# Patient Record
Sex: Female | Born: 2000 | Race: Black or African American | Hispanic: No | Marital: Single | Smoking: Never smoker
Health system: Southern US, Community
[De-identification: ages and names within clinical notes are randomized; demographics above are authoritative.]

---
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
## 2022-01-07 ENCOUNTER — Other Ambulatory Visit (INDEPENDENT_AMBULATORY_CARE_PROVIDER_SITE_OTHER): Payer: Self-pay | Admitting: Pediatric Endocrinology

## 2022-01-23 ENCOUNTER — Ambulatory Visit (INDEPENDENT_AMBULATORY_CARE_PROVIDER_SITE_OTHER): Payer: Medicaid Other | Admitting: Pediatric Endocrinology

## 2022-03-12 ENCOUNTER — Telehealth (INDEPENDENT_AMBULATORY_CARE_PROVIDER_SITE_OTHER): Payer: Self-pay | Admitting: Pediatric Endocrinology

## 2022-03-12 MED ORDER — ESCITALOPRAM OXALATE 5 MG PO TABS: 5.0000 mg | ORAL_TABLET | Freq: Every day | ORAL | 0 refills | Status: DC

## 2022-03-12 NOTE — Telephone Encounter (Signed)
?  Who's calling (name and relationship to patient) :  Army Fossa;  ? ?Best contact number: ?[phone] ? ?Provider they see: ?Dr. Vanessa Strandquist ? ?Reason for call: ?Mom has called in to see if Becky can have a 90 day supply of the Lexapro  ? ? ? ?PRESCRIPTION REFILL ONLY ? ?Name of prescription: ? ?[facility] [place],  [address] ? ?

## 2022-03-13 NOTE — Telephone Encounter (Signed)
Called to get pt scheduled had to LVM to call the office to schedule an appt with Dr. Vanessa [place].  ?

## 2022-03-22 ENCOUNTER — Encounter (INDEPENDENT_AMBULATORY_CARE_PROVIDER_SITE_OTHER): Payer: Self-pay | Admitting: Pediatric Endocrinology

## 2022-03-22 NOTE — Telephone Encounter (Signed)
A letter has been sent out.

## 2022-06-27 ENCOUNTER — Other Ambulatory Visit (INDEPENDENT_AMBULATORY_CARE_PROVIDER_SITE_OTHER): Payer: Self-pay | Admitting: Pediatric Endocrinology

## 2022-11-14 ENCOUNTER — Other Ambulatory Visit (INDEPENDENT_AMBULATORY_CARE_PROVIDER_SITE_OTHER): Payer: Self-pay | Admitting: Pediatric Endocrinology

## 2022-11-14 DIAGNOSIS — N914 Secondary oligomenorrhea: Secondary | ICD-10-CM

## 2023-02-07 ENCOUNTER — Other Ambulatory Visit (INDEPENDENT_AMBULATORY_CARE_PROVIDER_SITE_OTHER): Payer: Self-pay | Admitting: Pediatric Endocrinology

## 2023-02-07 DIAGNOSIS — N914 Secondary oligomenorrhea: Secondary | ICD-10-CM

## 2023-03-18 ENCOUNTER — Ambulatory Visit (INDEPENDENT_AMBULATORY_CARE_PROVIDER_SITE_OTHER): Payer: Medicaid Other | Admitting: Pediatric Endocrinology

## 2023-03-18 ENCOUNTER — Encounter (INDEPENDENT_AMBULATORY_CARE_PROVIDER_SITE_OTHER): Payer: Self-pay | Admitting: Pediatric Endocrinology

## 2023-03-18 DIAGNOSIS — E88819 Insulin resistance, unspecified: Secondary | ICD-10-CM | POA: Diagnosis not present

## 2023-03-18 DIAGNOSIS — N915 Oligomenorrhea, unspecified: Secondary | ICD-10-CM

## 2023-03-18 DIAGNOSIS — R635 Abnormal weight gain: Secondary | ICD-10-CM

## 2023-03-18 DIAGNOSIS — N914 Secondary oligomenorrhea: Secondary | ICD-10-CM

## 2023-03-18 MED ORDER — NORETHIN ACE-ETH ESTRAD-FE 1.5-30 MG-MCG PO TABS: 1.0000 | ORAL_TABLET | Freq: Every day | ORAL | 12 refills | Status: DC

## 2023-03-18 NOTE — Progress Notes (Signed)
Subjective:  Subjective  Patient Name: Pamela Berg Date of Birth: [birth year]  MRN: [id number]  Pamela Berg  presents to the office today for evaluation and management of her oligomenorrhea with elevated androgen levels   HISTORY OF PRESENT ILLNESS:   Pamela Berg is a 22 y.o. AA female   Thurma was unaccompanied today  1. Pamela Berg was seen by her PCP in October for her 18 year [place]. At that visit they discussed that she had not had a period in about 3-4 months. She had labs drawn which showed elevation in testosterone and androstenedione. She was referred for evaluation.    2. Pamela Berg was last seen in pediatric endocrine clinic on 10/23/21. In the interim she has been generally healthy.    She has returned to clinic at this time seeking a refill of her OCP prescription.   She has done well over the past year with her OCP. She feels that she is having regular cycles that are not super painful.   She is living on campus and walking around campus. She is not doing other exercise.  She is at [employer].   She is drinking water and some juice (not apple).   She feels that she is not eating as much as she used to. She typically eats 1-2 times a day.   She states that the birth control helped to level out her mood/depression. She stopped taking the Lexapro because she felt that it wasn't really helping anymore. She does not feel that she needs anything like that now. She is looking into getting back into therapy.   drawing, guitar, crochet.   She had continued on Junel 1.5/30. She has been out of medication for about the past month. She has a boyfriend but states that they are not having sex. They have been together about 3 months.    Maternal grandmother with history of PCOS.  Maternal grandparents with history of type 2 diabetes.   3. Pertinent Review of Systems:  Constitutional: The patient feels "good". The patient seems healthy and active. Eyes: Vision seems to be good. There are no recognized  eye problems. Neck: The patient has no complaints of anterior neck swelling, soreness, tenderness, pressure, discomfort, or difficulty swallowing.   Heart: Heart rate increases with exercise or other physical activity. The patient has no complaints of palpitations, irregular heart beats, chest pain, or chest pressure.   Lungs: no asthma or wheezing Gastrointestinal: Bowel movents seem normal. The patient has no complaints of excessive hunger, acid reflux, upset stomach, stomach aches or pains, diarrhea, or constipation.  Legs: Muscle mass and strength seem normal. There are no complaints of numbness, tingling, burning, or pain. No edema is noted.  Feet: There are no obvious foot problems. There are no complaints of numbness, tingling, burning, or pain. No edema is noted. Hands: now with bilateral ganglion cysts at wrist.  Neurologic: There are no recognized problems with muscle movement and strength, sensation, or coordination. GYN/GU: per HPI. LMP 03/10/23  PAST MEDICAL, FAMILY, AND SOCIAL HISTORY  Past Medical History:  Diagnosis Date   Premature baby     Family History  Problem Relation Age of Onset   Depression Mother    ADD / ADHD Mother    Hypertension Mother    Vision loss Mother    Cancer Maternal Aunt    Diabetes Maternal Grandmother    Arthritis Maternal Grandmother    Hypertension Maternal Grandmother    Miscarriages / Stillbirths Maternal Grandmother    Diabetes Maternal  Grandfather    Stroke Maternal Grandfather    Arthritis Maternal Grandfather    Hypertension Maternal Grandfather      Current Outpatient Medications:    norethindrone-ethinyl estradiol-iron (JUNEL FE 1.5/30) 1.5-30 MG-MCG tablet, TAKE 1 TABLET BY MOUTH EVERY DAY, Disp: 30 tablet, Rfl: 0   clindamycin-benzoyl peroxide (BENZACLIN) gel, Apply 1 application topically every morning., Disp: , Rfl:    escitalopram (LEXAPRO) 5 MG tablet, TAKE 1 TABLET BY MOUTH EVERY DAY, Disp: 30 tablet, Rfl: 0  Allergies  as of 03/18/2023 - Review Complete 03/18/2023  Allergen Reaction Noted   Shellfish-derived products Swelling 10/26/2019     reports that she has never smoked. She has never been exposed to tobacco smoke. She has never used smokeless tobacco. She reports that she does not drink alcohol and does not use drugs. Pediatric History  Patient Parents   Errico,Nadia (Mother)   Other Topics Concern   Not on file  Social History Narrative   12th grade at [employer].    Lives with mom and partner, and 2 dogs.    1. School and Family: [employer] for [employer]. Lives on [place] 2. Activities: not active  3. Primary Care Provider: [facility] [place]  ROS: There are no other significant problems involving Pamela Berg's other body systems.    Objective:  Objective  Vital Signs:    BP 124/82 (BP Location: Left Arm, Patient Position: Sitting, Cuff Size: Large)   Pulse 88   Wt 225 lb 3.2 oz (102.2 kg)   LMP 03/10/2023 (Exact Date)   Blood pressure %iles are not available for patients who are 18 years or older.  Ht Readings from Last 3 Encounters:  10/23/21 5' 4.17" (1.63 m) (49 %, Z= -0.02)*  03/26/21 5\' 4"  (1.626 m) (47 %, Z= -0.07)*  11/28/20 5' 4.17" (1.63 m) (50 %, Z= 0.01)*   * Growth percentiles are based on CDC (Girls, 2-20 Years) data.   Wt Readings from Last 3 Encounters:  03/18/23 225 lb 3.2 oz (102.2 kg) (99 %, Z= 2.26)*  10/23/21 (!) 215 lb 6 oz (97.7 kg) (98 %, Z= 2.16)*  03/26/21 (!) 212 lb 3.2 oz (96.3 kg) (98 %, Z= 2.14)*   * Growth percentiles are based on CDC (Girls, 2-20 Years) data.   HC Readings from Last 3 Encounters:  No data found for [facility]   There is no height or weight on file to calculate BSA. No height on file for this encounter. 99 %ile (Z= 2.26) based on CDC (Girls, 2-20 Years) weight-for-age data using vitals from 03/18/2023.   PHYSICAL EXAM:   Constitutional: The patient appears healthy and well nourished. The patient's height and weight are heavy  for age. +10 pounds since last visit. Head: The head is normocephalic. Face: The face appears normal. There are no obvious dysmorphic features. Eyes: The eyes appear to be normally formed and spaced. Gaze is conjugate. There is no obvious arcus or proptosis. Moisture appears normal. Ears: The ears are normally placed and appear externally normal. Neck: The neck appears to be visibly normal. The thyroid gland is enlarged today. The consistency of the thyroid gland is firm. The thyroid gland is not tender to palpation. Lungs: Normal work of breathing Heart: Normal pulses and peripheral perfusion Abdomen: The abdomen appears to be large in size for the patient's age. Arms: Muscle size and bulk are normal for age. Hands: There is no obvious tremor. Phalangeal and metacarpophalangeal joints are normal. Palmar muscles are normal for age.  Palmar skin is normal. Palmar moisture is also normal. Legs: Muscles appear normal for age. No edema is present. Feet: Feet are normally formed.  Neurologic: Strength is normal for age in both the upper and lower extremities. Muscle tone is normal. Sensation to touch is normal in both the legs and feet.   GYN/GU: Puberty: Tanner stage pubic hair: V Tanner stage breast/genital V.  LAB DATA:   No results found for this or any previous visit (from the past 672 hour(s)).    Assessment and Plan:  Assessment  ASSESSMENT: Shantise is a 22 y.o. female referred for oligomenorrhea and elevated androgen levels. She is now having regular menses on OCP.    Oligomenorrhea/elevated androgens -  Has continued on Junel fe 1.5/30 since last visit -  Now having monthly cycles with light flow and minimal cramping - No issues  Weight gain/insulin resistance - ReSet goals for next visit of increased exercise,  and decreased juice intake.  - Doing well with portion sizes and decreased snacking.   Follow-up: Return in about 9 months (around 12/18/2023).      Lelon Huh,  MD   LOS >30 minutes spent today reviewing the medical chart, counseling the patient/family, and documenting today's encounter.      Patient referred by [facility] [place] for oligomenorrhea  Copy of this note sent to [facility] [place]

## 2023-07-04 ENCOUNTER — Encounter (INDEPENDENT_AMBULATORY_CARE_PROVIDER_SITE_OTHER): Payer: Self-pay

## 2023-12-18 ENCOUNTER — Ambulatory Visit (INDEPENDENT_AMBULATORY_CARE_PROVIDER_SITE_OTHER): Payer: Medicaid Other | Admitting: Pediatric Endocrinology

## 2024-05-11 ENCOUNTER — Other Ambulatory Visit (INDEPENDENT_AMBULATORY_CARE_PROVIDER_SITE_OTHER): Payer: Self-pay | Admitting: Pediatric Endocrinology

## 2024-05-11 DIAGNOSIS — N914 Secondary oligomenorrhea: Secondary | ICD-10-CM

## 2024-05-12 ENCOUNTER — Telehealth (INDEPENDENT_AMBULATORY_CARE_PROVIDER_SITE_OTHER): Payer: Self-pay | Admitting: Pediatric Endocrinology

## 2024-05-12 NOTE — Telephone Encounter (Signed)
 Called I informed her that we can't send in the refill since she hasn't been seen she needs to go to her pcp to get the birth control.

## 2024-05-12 NOTE — Telephone Encounter (Signed)
  Name of who is calling: Pamela Berg Relationship to Patient: self  Best contact number: [phone]   Provider they see: [facility]  Reason for call: Pt stated that she needs a RX refill for Junel FE but stated she doesn't know if she needs an appt in order to refill     PRESCRIPTION REFILL ONLY  Name of prescription:  Pharmacy:

## 2024-06-16 NOTE — Progress Notes (Signed)
 Subjective   HPI:  Pamela Berg is a 23 y.o.  female who presents to the office with:  Chief Complaint  Patient presents with  . Establish Care    Patient presents today to establish care. Patient would like to discuss having birth control filled, Junel. Concerns of discharge. Onset about 2 weeks ago. Refill on epipen .   Patient with PMHx PCOS/oligomenorrhea, vitamin D deficiency, anxiety/depression, obesity presents to establish care. She is requesting a refill of her Junel OCP for management of PCOS and pregnancy prevention. LMP 05/15/24. Also needs a refill of her epi pen she carries due to shellfish allergy.  She also complains of 2 weeks of vaginal discharge.  She describes this as a creamy white texture with a slight odor.  No associated vaginal pain, itching, urinary symptoms, abdominal pain, nausea, vomiting, fevers, chills.  Anxiety/depression - patient reports mental health is in a good place right now and she has a great support system.  Bilateral palmar ganglion cysts - does not bother patient.   PMH: Past medical history, Past surgical history, Social history, family history were reviewed as noted in EMR.  Pertinent for:  History reviewed. No pertinent past medical history.   Medications and allergies reviewed.  ROS: All systems were negative including constitutional, CV, respiratory, GI/GU except for those noted in the HPI.  Objective   Vital Signs: BP 130/86 (BP Location: Right Upper Arm)   Pulse 96   Temp 97.3 F (36.3 C) (Temporal)   Resp 14   Ht 5' 4.45 (1.637 m)   Wt 245 lb 6.4 oz (111.3 kg)   SpO2 97%   BMI 41.54 kg/m   Wt Readings from Last 3 Encounters:  06/16/24 245 lb 6.4 oz (111.3 kg)   Physical Exam Constitutional:      General: She is not in acute distress.    Appearance: Normal appearance. She is obese.  Cardiovascular:     Rate and Rhythm: Normal rate and regular rhythm.     Pulses: Normal pulses.     Heart sounds: Normal heart sounds. No  murmur heard.    No gallop.  Pulmonary:     Effort: Pulmonary effort is normal. No respiratory distress.     Breath sounds: Normal breath sounds. No wheezing, rhonchi or rales.  Abdominal:     General: Bowel sounds are normal. There is no distension.     Palpations: Abdomen is soft.     Tenderness: There is no abdominal tenderness. There is no guarding.  Genitourinary:    Vagina: Vaginal discharge (white creamy) and erythema present. No tenderness, bleeding or lesions.     Cervix: Normal.     Uterus: Normal.      Adnexa: Right adnexa normal and left adnexa normal.     Comments: Chaperone present Musculoskeletal:     Cervical back: Neck supple. No tenderness.     Right lower leg: No edema.     Left lower leg: No edema.  Lymphadenopathy:     Cervical: No cervical adenopathy.  Skin:    General: Skin is warm and dry.  Neurological:     Mental Status: She is alert. Mental status is at baseline.  Psychiatric:        Mood and Affect: Mood normal.      Assessment/Plan   Orders Placed This Encounter  Procedures  . BV, Candida, TV, CT, & GC, NAA vag swab Vaginal  . CBC And Differential  . Comprehensive Metabolic Panel  . TSH Rfx  on Abnormal to Free T4  . Lipid Panel  . Hemoglobin A1c  . Vitamin D 25 Hydroxy  . POCT Urinalysis Dipstick  . POCT urine pregnancy test    1. Vaginal discharge   2. Oligomenorrhea, unspecified type   3. Vitamin D deficiency   4. Depression with anxiety   5. Palmar wrist ganglion   6. BCP (birth control pills) initiation   7. Shellfish allergy    Vaginal discharge UA negative for signs of infection. Vaginal swab obtained and results pending. Await results to help guide management. Return precautions given.  Oligomenorrhea Check basic labs today. UPT negative in office and refill of Junel OCP sent.   Vitamin D deficiency Check today.   Depression with anxiety Currently stable and patient declines additional support.   Palmar wrist  ganglion Stable, does not affect ADLs.   Shellfish allergy Refill Epi pen sent.    Follow up in about 3 months (around 09/16/2024) for pending labs.  No future appointments.  Medications at end of visit today:  Current Outpatient Medications:  .  EPINEPHrine  (EPIPEN  2-PAK) 0.3 mg/0.3 mL injection, Inject 0.3 mLs (0.3 mg dose) into the muscle once as needed for Anaphylaxis for up to 1 dose., Disp: 1 each, Rfl: 0 .  norethindrone-ethinyl estradiol-iron (LOESTRIN-FE,MICROGESTIN-FE,JUNEL-FE,LARIN-FE) 1.5-30 MG-MCG tablet, Take one tablet by mouth daily., Disp: 28 tablet, Rfl: 2  Patient Care Team: Chianne Kline, PA-C as PCP - General (Family Medicine)

## 2024-08-03 NOTE — Progress Notes (Signed)
 Subjective   HPI:  Pamela Berg is a 23 y.o.  female who presents to the office with:  Chief Complaint  Patient presents with  . Depression  . Anxiety   Patient presents to discuss her mental health. She states her anxiety and depression have worsened since her last office visit to the point where she is no longer able to manage them on her own. She constantly feels anxious for no reason and has been more fidgety/restless. Symptoms are starting to affect her grades in school to where she may lose her financial aid - which only worsens her anxiety. She also feels depressed living at home with her parents and feels like a burden. She is currently unemployed but has a job interview next week. She would like to discuss medication and therapy. Previously on Lexapro  and thought it helped initially but stopped working for her which led her to discontinuing it. She had a great therapist that was helpful, but she left the office and her replacement was not a good fit for patient.  1. Little interest or pleasure in doing things: 3 (08/03/2024  3:13 PM) 2. Feeling down, depressed, or hopeless: 2 (08/03/2024  3:13 PM) PHQ-2 Total Score: 5 (08/03/2024  3:13 PM) 3. Trouble falling or staying asleep: 2 (08/03/2024  3:13 PM) 4. Feeling tired or having little energy: 2 (08/03/2024  3:13 PM) 5. Poor appetite or overeating: 2 (08/03/2024  3:13 PM) 6. Feeling bad about yourself - or that you are a failure or have let yourself or your family down: 3 (08/03/2024  3:13 PM) 7. Trouble concentrating on things, such as reading the newspaper or watching television: 0 (08/03/2024  3:13 PM) 8. Moving or speaking so slowly that other people could have noticed.  Or the opposite - being so fidgety or restless that you have been moving around a lot more than usual.: 2 (08/03/2024  3:13 PM) 9. Thoughts that you would be better off dead, or of hurting yourself in some way.: 0 (08/03/2024  3:13 PM) PHQ Total Score: 16 (08/03/2024  3:13 PM) 10. How  difficult have these problems made it for you to do your work, take care of things at home or get along with other people?: Somewhat difficult (08/03/2024  3:13 PM)   GAD7 Review       08/03/2024  Generalized Anxiety Disorder 7 item (GAD-7)  1. Feeling nervous, anxious, or on the edge 3  2. Not being able to stop or control worrying 3  3. Worrying too much about different things 2  4. Trouble relaxing 1  5. Being so restless that it's hard to sit still 2  6. Being easily annoyed or irritable 1  7. Feeling afraid as if something awful might happen 2  Total Score 14  Interpretation:  Moderate, likely to have an anxiety disorder   If you checked off any problems, how difficult have these made it for you to do your work, take care of things at home, or get along with other people? 1     PMH: Past medical history, Past surgical history, Social history, family history were reviewed as noted in EMR.  Pertinent for:  History reviewed. No pertinent past medical history.   Medications and allergies reviewed.  ROS: Per HPI. No SI, HI  Objective   Vital Signs: BP (!) 124/96 (BP Location: Left Upper Arm)   Pulse 104   Temp 98.5 F (36.9 C) (Temporal)   Resp 18   Ht 5'  4.45 (1.637 m)   Wt 246 lb 3.2 oz (111.7 kg)   LMP 07/12/2024   SpO2 97%   Breastfeeding No   BMI 41.67 kg/m   Wt Readings from Last 3 Encounters:  08/03/24 246 lb 3.2 oz (111.7 kg)  06/16/24 245 lb 6.4 oz (111.3 kg)   Physical Exam Constitutional:      General: She is not in acute distress.    Appearance: Normal appearance.  Neurological:     Mental Status: She is alert.  Psychiatric:        Mood and Affect: Mood normal.      Assessment/Plan   Orders Placed This Encounter  Procedures  . Ambulatory referral to Psychology/Testing    1. Depression with anxiety     Depression with anxiety Worsened since last visit. Discussed medication including risks, benefits, and potential SE. Start trial of  Sertraline  and will titrate to effectiveness. Additionally discussed therapy and psych referral has been placed. Patient has appt scheduled 09/16/24 at which time will reassess symptoms. Encouraged her to follow up sooner as needed. Also provided local mental health resources.    No follow-ups on file.  Future Appointments  Date Time Provider Department Center  09/16/2024  2:00 PM Chianne Kline, PA-C [facility] None    Medications at end of visit today: Current Medications[1]  Patient Care Team: Chianne Kline, PA-C as PCP - General (Family Medicine)          [1]  Current Outpatient Medications:  .  EPINEPHrine  (EPIPEN  2-PAK) 0.3 mg/0.3 mL injection, Inject 0.3 mLs (0.3 mg dose) into the muscle once as needed for Anaphylaxis for up to 1 dose., Disp: 1 each, Rfl: 0 .  ergocalciferol (VITAMIN D2) 50,000 units CAPS capsule, Take one capsule (50,000 Units dose) by mouth once a week at 0900 for 12 doses., Disp: 12 capsule, Rfl: 0 .  metroNIDAZOLE (METROGEL) 0.75% vaginal gel, Place vaginally at bedtime. For 5 nights, Disp: 70 g, Rfl: 0 .  norethindrone-ethinyl estradiol-iron (LOESTRIN-FE,MICROGESTIN-FE,JUNEL-FE,LARIN-FE) 1.5-30 MG-MCG tablet, Take one tablet by mouth daily., Disp: 28 tablet, Rfl: 2 .  sertraline  (ZOLOFT ) 25 mg tablet, Take one tablet (25 mg dose) by mouth daily., Disp: 30 tablet, Rfl: 2

## 2024-09-16 NOTE — Progress Notes (Signed)
 Subjective   HPI:  Pamela Berg is a 23 y.o.  female who presents to the office with:  Chief Complaint  Patient presents with  . Follow-up   Patient presents today for 3 month follow up. She reports completing her vitamin D and is agreeable to rechecking it today. She also completed treatment for BV and chlamydia, however does continue to experience vaginal discharge with an odor. She is agreeable to retesting today.   She and her mom have noticed positive improvements in her mental health and she would like to continue the Sertraline . No side effects noted.   ROS: Negative for constitutional, CV, Resp, Abd/GU symptoms except as noted above in HPI.  PMH: Past medical history, Past surgical history, Social history, family history were reviewed as noted in EMR.  Pertinent for:  History reviewed. No pertinent past medical history.   Medications and allergies reviewed.   Objective    Physical Exam:  Vital Signs: BP 108/84   Pulse 68   Temp 98.9 F (37.2 C) (Temporal)   Resp 16   Ht 5' 4.45 (1.637 m)   Wt 237 lb 3.2 oz (107.6 kg)   LMP 09/04/2024 (Exact Date)   SpO2 99%   Breastfeeding No   BMI 40.15 kg/m   General:  Well appearing, Alert & Oriented CV:  Normal to palpation without thrill.  Regular Rate and Rhythm, No murmurs, rubs, or gallops.   Respiratory:  Normal respiratory effort.  No wheezes or crackles.  Good air movement without diminished sounds.   Abdomen:  Normal bowel sounds.  Non-tender to palpation.  No rebound or guarding.  No palpable liver or spleen. Extremities:  No pretibial edema  Assessment/Plan   Orders Placed This Encounter  Procedures  . CT, GC & TV, NAA vag swab Urine  . Vitamin D 25 Hydroxy    Depression with anxiety Improved, continue Sertraline  25mg . Patient had not heard from psych referral so I provided her with [facility]'s office number and she will call to schedule an appointment.  Vitamin D deficiency Patient completed 12 weeks of  prescription vitamin D. Will recheck level today.   Vaginal discharge, BV, chlamydia STD retesting today. Will trial course of Clindamycin PO for continued BV symptoms. No red flags or systemic symptoms reported. Exam benign and vitals are stable.  No follow-ups on file.  No future appointments.  Medications at end of visit today: Current Medications[1]       [1]  Current Outpatient Medications:  .  clindamycin (CLEOCIN) 300 mg capsule, Take one capsule (300 mg dose) by mouth 3 (three) times a day for 7 days., Disp: 21 capsule, Rfl: 0 .  EPINEPHrine  (EPIPEN  2-PAK) 0.3 mg/0.3 mL injection, Inject 0.3 mLs (0.3 mg dose) into the muscle once as needed for Anaphylaxis for up to 1 dose., Disp: 1 each, Rfl: 0 .  ergocalciferol (VITAMIN D2) 50,000 units CAPS capsule, Take one capsule (50,000 Units dose) by mouth once a week at 0900 for 12 doses., Disp: 12 capsule, Rfl: 0 .  norethindrone-ethinyl estradiol-iron (LOESTRIN-FE,MICROGESTIN-FE,JUNEL-FE,LARIN-FE) 1.5-30 MG-MCG tablet, Take one tablet by mouth daily., Disp: 28 tablet, Rfl: 2 .  sertraline  (ZOLOFT ) 25 mg tablet, Take one tablet (25 mg dose) by mouth daily., Disp: 30 tablet, Rfl: 2

## 2024-09-22 ENCOUNTER — Encounter (HOSPITAL_BASED_OUTPATIENT_CLINIC_OR_DEPARTMENT_OTHER): Payer: Self-pay

## 2024-09-22 ENCOUNTER — Inpatient Hospital Stay (HOSPITAL_BASED_OUTPATIENT_CLINIC_OR_DEPARTMENT_OTHER)
Admission: EM | Admit: 2024-09-22 | Discharge: 2024-09-27 | DRG: 176 | Disposition: A | Discharge status: Home / Self Care | Attending: Internal Medicine | Admitting: Internal Medicine

## 2024-09-22 ENCOUNTER — Other Ambulatory Visit: Payer: Self-pay

## 2024-09-22 ENCOUNTER — Emergency Department (HOSPITAL_BASED_OUTPATIENT_CLINIC_OR_DEPARTMENT_OTHER)

## 2024-09-22 DIAGNOSIS — I2694 Multiple subsegmental pulmonary emboli without acute cor pulmonale: Principal | ICD-10-CM | POA: Diagnosis present

## 2024-09-22 DIAGNOSIS — Z833 Family history of diabetes mellitus: Secondary | ICD-10-CM

## 2024-09-22 DIAGNOSIS — Z793 Long term (current) use of hormonal contraceptives: Secondary | ICD-10-CM

## 2024-09-22 DIAGNOSIS — E669 Obesity, unspecified: Secondary | ICD-10-CM | POA: Diagnosis present

## 2024-09-22 DIAGNOSIS — R55 Syncope and collapse: Secondary | ICD-10-CM | POA: Diagnosis not present

## 2024-09-22 DIAGNOSIS — D72829 Elevated white blood cell count, unspecified: Secondary | ICD-10-CM | POA: Diagnosis present

## 2024-09-22 DIAGNOSIS — I2699 Other pulmonary embolism without acute cor pulmonale: Secondary | ICD-10-CM | POA: Diagnosis present

## 2024-09-22 DIAGNOSIS — Z6841 Body Mass Index (BMI) 40.0 and over, adult: Secondary | ICD-10-CM

## 2024-09-22 DIAGNOSIS — Z1152 Encounter for screening for COVID-19: Secondary | ICD-10-CM

## 2024-09-22 DIAGNOSIS — Z8249 Family history of ischemic heart disease and other diseases of the circulatory system: Secondary | ICD-10-CM

## 2024-09-22 DIAGNOSIS — R030 Elevated blood-pressure reading, without diagnosis of hypertension: Secondary | ICD-10-CM | POA: Diagnosis present

## 2024-09-22 DIAGNOSIS — R0902 Hypoxemia: Secondary | ICD-10-CM | POA: Diagnosis not present

## 2024-09-22 DIAGNOSIS — T384X5A Adverse effect of oral contraceptives, initial encounter: Secondary | ICD-10-CM | POA: Diagnosis present

## 2024-09-22 DIAGNOSIS — R000 Tachycardia, unspecified: Secondary | ICD-10-CM | POA: Diagnosis not present

## 2024-09-22 DIAGNOSIS — Z91013 Allergy to seafood: Secondary | ICD-10-CM

## 2024-09-22 LAB — CBC WITH DIFFERENTIAL/PLATELET
Abs Immature Granulocytes: 0.06 K/uL (ref 0.00–0.07)
Basophils Absolute: 0.1 K/uL (ref 0.0–0.1)
Basophils Relative: 1 %
Eosinophils Absolute: 0.2 K/uL (ref 0.0–0.5)
Eosinophils Relative: 1 %
HCT: 42.7 % (ref 36.0–46.0)
Hemoglobin: 13.7 g/dL (ref 12.0–15.0)
Immature Granulocytes: 1 %
Lymphocytes Relative: 18 %
Lymphs Abs: 2.3 K/uL (ref 0.7–4.0)
MCH: 23.9 pg — ABNORMAL LOW (ref 26.0–34.0)
MCHC: 32.1 g/dL (ref 30.0–36.0)
MCV: 74.5 fL — ABNORMAL LOW (ref 80.0–100.0)
Monocytes Absolute: 0.9 K/uL (ref 0.1–1.0)
Monocytes Relative: 8 %
Neutro Abs: 8.9 K/uL — ABNORMAL HIGH (ref 1.7–7.7)
Neutrophils Relative %: 71 %
Platelets: 293 K/uL (ref 150–400)
RBC: 5.73 MIL/uL — ABNORMAL HIGH (ref 3.87–5.11)
RDW: 15.4 % (ref 11.5–15.5)
WBC: 12.4 K/uL — ABNORMAL HIGH (ref 4.0–10.5)
nRBC: 0 % (ref 0.0–0.2)

## 2024-09-22 LAB — PREGNANCY, URINE: Preg Test, Ur: NEGATIVE

## 2024-09-22 LAB — TROPONIN T, HIGH SENSITIVITY
Troponin T High Sensitivity: <15 ng/L (ref 0–19)
Troponin T High Sensitivity: <15 ng/L (ref 0–19)

## 2024-09-22 LAB — D-DIMER, QUANTITATIVE: D-Dimer, Quant: 10.54 ug{FEU}/mL — ABNORMAL HIGH (ref 0.00–0.50)

## 2024-09-22 LAB — RESP PANEL BY RT-PCR (RSV, FLU A&B, COVID)  RVPGX2
Influenza A by PCR: NEGATIVE
Influenza B by PCR: NEGATIVE
Resp Syncytial Virus by PCR: NEGATIVE
SARS Coronavirus 2 by RT PCR: NEGATIVE

## 2024-09-22 LAB — BASIC METABOLIC PANEL WITH GFR
Anion gap: 13 (ref 5–15)
BUN: 9 mg/dL (ref 6–20)
CO2: 21 mmol/L — ABNORMAL LOW (ref 22–32)
Calcium: 9.4 mg/dL (ref 8.9–10.3)
Chloride: 104 mmol/L (ref 98–111)
Creatinine, Ser: 0.7 mg/dL (ref 0.44–1.00)
GFR, Estimated: >60 mL/min (ref >60)
Glucose, Bld: 92 mg/dL (ref 70–99)
Potassium: 4.6 mmol/L (ref 3.5–5.1)
Sodium: 138 mmol/L (ref 135–145)

## 2024-09-22 LAB — PRO BRAIN NATRIURETIC PEPTIDE: Pro Brain Natriuretic Peptide: <50 pg/mL (ref <300.0)

## 2024-09-22 MED ORDER — HEPARIN (PORCINE) 25000 UT/250ML-% IV SOLN
1500.0000 [IU]/h | INTRAVENOUS | Status: DC
Start: 2024-09-22 — End: 2024-09-25
  Administered 2024-09-22: 1350 [IU]/h via INTRAVENOUS
  Administered 2024-09-24 – 2024-09-25 (×2): 1500 [IU]/h via INTRAVENOUS
  Filled 2024-09-22 (×4): qty 250

## 2024-09-22 MED ORDER — SODIUM CHLORIDE 0.9 % IV BOLUS
1000.0000 mL | Freq: Once | INTRAVENOUS | Status: AC
  Administered 2024-09-22: 1000 mL via INTRAVENOUS

## 2024-09-22 MED ORDER — HEPARIN BOLUS VIA INFUSION
4500.0000 [IU] | Freq: Once | INTRAVENOUS | Status: AC
  Administered 2024-09-22: 4500 [IU] via INTRAVENOUS

## 2024-09-22 MED ORDER — ACETAMINOPHEN 500 MG PO TABS
1000.0000 mg | ORAL_TABLET | Freq: Once | ORAL | Status: AC
  Administered 2024-09-22: 1000 mg via ORAL
  Filled 2024-09-22: qty 2

## 2024-09-22 MED ORDER — IOHEXOL 350 MG/ML SOLN
80.0000 mL | Freq: Once | INTRAVENOUS | Status: AC | PRN
  Administered 2024-09-22: 80 mL via INTRAVENOUS

## 2024-09-22 NOTE — ED Provider Notes (Signed)
 [place] [facility] Provider Note   CSN: [id number] Arrival date & time: 09/22/24  1644     Patient presents with: Shortness of Breath   Otisha Spickler is a 23 y.o. female.   Patient is a 23 year old female with no significant past medical history who presents with shortness of breath and left-sided chest pain.  She has had some pain in her left chest and left upper back for about 2 days.  She says it hurts when she breathes and hurts when she moves.  She did pick up a heavy jug of water and thought maybe she injured it because of that.  She has been having some intermittent shortness of breath.  She also has noticed that her left leg seems a little more swollen than the right.  No calf pain.  No fevers.  No cough or cold symptoms other than a little bit of nasal congestion which she attributes to allergies.  No history of DVT.  She is on oral contraceptives.       Prior to Admission medications   Medication Sig Start Date End Date Taking? Authorizing Provider  norethindrone-ethinyl estradiol-iron (JUNEL FE 1.5/30) 1.5-30 MG-MCG tablet Take 1 tablet by mouth daily. 03/18/23   Dorrene Nest, MD    Allergies: Shellfish-derived products    Review of Systems  Constitutional:  Positive for fatigue. Negative for chills, diaphoresis and fever.  HENT:  Negative for congestion, rhinorrhea and sneezing.   Eyes: Negative.   Respiratory:  Positive for shortness of breath. Negative for cough.   Cardiovascular:  Positive for chest pain and leg swelling.  Gastrointestinal:  Negative for abdominal pain, diarrhea, nausea and vomiting.  Genitourinary:  Negative for difficulty urinating, flank pain, frequency and hematuria.  Musculoskeletal:  Negative for arthralgias and back pain.  Skin:  Negative for rash.  Neurological:  Negative for dizziness, speech difficulty, weakness, numbness and headaches.    Updated Vital Signs BP (!) 142/94   Pulse (!) 115    Temp (!) 100.5 F (38.1 C) (Oral)   Resp (!) 21   LMP 09/02/2024 (Exact Date)   SpO2 97%   Physical Exam Constitutional:      Appearance: She is well-developed.  HENT:     Head: Normocephalic and atraumatic.  Eyes:     Pupils: Pupils are equal, round, and reactive to light.  Cardiovascular:     Rate and Rhythm: Normal rate and regular rhythm.     Heart sounds: Normal heart sounds.  Pulmonary:     Effort: Pulmonary effort is normal. No respiratory distress.     Breath sounds: Normal breath sounds. No wheezing or rales.     Comments: No reproducible tenderness to the chest wall Chest:     Chest wall: No tenderness.  Abdominal:     General: Bowel sounds are normal.     Palpations: Abdomen is soft.     Tenderness: There is no abdominal tenderness. There is no guarding or rebound.  Musculoskeletal:        General: Normal range of motion.     Cervical back: Normal range of motion and neck supple.     Comments: Trace edema to left lower extremity as compared to the right.  No calf tenderness  Lymphadenopathy:     Cervical: No cervical adenopathy.  Skin:    General: Skin is warm and dry.     Findings: No rash.  Neurological:     Mental Status: She is alert and  oriented to person, place, and time.     (all labs ordered are listed, but only abnormal results are displayed) Labs Reviewed  BASIC METABOLIC PANEL WITH GFR - Abnormal; Notable for the following components:      Result Value   CO2 21 (*)    All other components within normal limits  D-DIMER, QUANTITATIVE - Abnormal; Notable for the following components:   D-Dimer, Quant 10.54 (*)    All other components within normal limits  CBC WITH DIFFERENTIAL/PLATELET - Abnormal; Notable for the following components:   WBC 12.4 (*)    RBC 5.73 (*)    MCV 74.5 (*)    MCH 23.9 (*)    Neutro Abs 8.9 (*)    All other components within normal limits  RESP PANEL BY RT-PCR (RSV, FLU A&B, COVID)  RVPGX2  PREGNANCY, URINE  HEPARIN   LEVEL (UNFRACTIONATED)  TROPONIN T, HIGH SENSITIVITY  TROPONIN T, HIGH SENSITIVITY    EKG: EKG Interpretation Date/Time:  Wednesday September 22 2024 16:53:58 EDT Ventricular Rate:  123 PR Interval:  135 QRS Duration:  75 QT Interval:  310 QTC Calculation: 444 R Axis:   63  Text Interpretation: Sinus tachycardia Borderline T abnormalities, anterior leads No old tracing to compare Confirmed by Lenor Hollering [phone]) on 09/22/2024 6:39:06 PM  Radiology: CT Angio Chest PE W/Cm &/Or Wo Cm Result Date: 09/22/2024 EXAM: CTA CHEST 09/22/2024 08:54:38 PM TECHNIQUE: CTA of the chest was performed after the administration of intravenous contrast (iohexol  (OMNIPAQUE ) 350 MG/ML injection 80 mL IOHEXOL  350 MG/ML SOLN). Multiplanar reformatted images are provided for review. MIP images are provided for review. Automated exposure control, iterative reconstruction, and/or weight based adjustment of the mA/kV was utilized to reduce the radiation dose to as low as reasonably achievable. COMPARISON: None available. CLINICAL HISTORY: Pulmonary embolism (PE) suspected, low to intermediate prob, positive D-dimer. PE suspected, elevated Ddimer, lower leg and feet swelling, tachycardic. FINDINGS: PULMONARY ARTERIES: There are branching intraluminal filling defects identified within the lobar and segmental pulmonary arteries of the lower lobes bilaterally in keeping with acute pulmonary emboli. The embolic burden is moderate. MEDIASTINUM: No CT evidence of right heart strain. No significant coronary artery calcification. Global cardiac size within normal limits. No pericardial effusion. Thoracic Aorta is unremarkable. LYMPH NODES: No pathologic thoracic adenopathy. LUNGS AND PLEURA: Patchy ground glass opacity within the basilar lingula, which may represent atelectasis or infiltrate in the setting of a developing pulmonary infarct. Bibasilar atelectasis noted. No pneumothorax or pleural effusion. No central obstructing  lesion. UPPER ABDOMEN: Limited images of the upper abdomen are unremarkable. SOFT TISSUES AND BONES: No acute bone abnormality. No lytic or blastic bone lesion. IMPRESSION: 1. Acute pulmonary emboli involving the lobar and segmental pulmonary arteries of the lower lobes bilaterally, with moderate embolic burden. No CT evidence of right heart strain. 2. Patchy ground glass opacity within the basilar lingula, possibly representing atelectasis or infiltrate in the setting of a developing pulmonary infarct.\ 3. Findings were discussed personally with Dr. Lenor at time of dictation Electronically signed by: Dorethia Molt MD 09/22/2024 09:16 PM EDT RP Workstation: [id number]   DG Chest 2 View Result Date: 09/22/2024 CLINICAL DATA:  SHOB EXAM: CHEST - 2 VIEW COMPARISON:  None available. FINDINGS: Lower lung volumes. Bilateral perihilar interstitial opacities. No focal airspace consolidation, pleural effusion, or pneumothorax. No cardiomegaly.No acute fracture or destructive lesion. IMPRESSION: Bilateral perihilar interstitial opacities, which may represent bronchovascular crowding due to low lung volumes, atypical/viral infection, or changes of asthma. Electronically Signed  By: Rogelia Myers M.D.   On: 09/22/2024 17:29     Procedures   Medications Ordered in the ED  heparin  ADULT infusion 100 units/mL (25000 units/250mL) (1,350 Units/hr Intravenous New Bag/Given 09/22/24 2138)  iohexol  (OMNIPAQUE ) 350 MG/ML injection 80 mL (80 mLs Intravenous Contrast Given 09/22/24 2052)  acetaminophen  (TYLENOL ) tablet 1,000 mg (1,000 mg Oral Given 09/22/24 2102)  sodium chloride  0.9 % bolus 1,000 mL (0 mLs Intravenous Stopped 09/22/24 2233)  heparin  bolus via infusion 4,500 Units (4,500 Units Intravenous Bolus from Bag 09/22/24 2138)                                    Medical Decision Making Amount and/or Complexity of Data Reviewed Labs: ordered. Radiology: ordered.  Risk OTC drugs. Prescription drug  management. Decision regarding hospitalization.   This patient presents to the ED for concern of chest pain, shortness of breath, this involves an extensive number of treatment options, and is a complaint that carries with it a high risk of complications and morbidity.  I considered the following differential and admission for this acute, potentially life threatening condition.  The differential diagnosis includes pneumonia, pneumothorax, PE, pleurisy, musculoskeletal pain, pericarditis, ACS  MDM:    Patient is a 23 year old who presents with left-sided chest pain and shortness of breath.  She is tachycardic on arrival.  No hypoxia.  She did develop a low-grade temp of 100.5.  Chest x-ray does not show any acute abnormality.  No pneumonia or pneumothorax.  She has some slight swelling of her left leg as compared to the right.  I had ordered ultrasound but unfortunately we do not have ultrasound here today.  D-dimer is positive.  CTA of the chest was performed which shows bilateral PEs with a fairly significant clot burden.  No evidence of right heart strain.  There is developing pulmonary infarct in the lingula.  She was started on heparin .  Her troponins are normal.  Discussed with Dr. Maree with pulmonology who recommends patient be admitted to the hospitalist service.  Does not recommend thrombectomy/thrombolytics.  Discussed with Dr. Franky who will admit the patient for further treatment.  CRITICAL CARE Performed by: Andrea Ness Total critical care time: 70 minutes Critical care time was exclusive of separately billable procedures and treating other patients. Critical care was necessary to treat or prevent imminent or life-threatening deterioration. Critical care was time spent personally by me on the following activities: development of treatment plan with patient and/or surrogate as well as nursing, discussions with consultants, evaluation of patient's response to treatment, examination of  patient, obtaining history from patient or surrogate, ordering and performing treatments and interventions, ordering and review of laboratory studies, ordering and review of radiographic studies, pulse oximetry and re-evaluation of patient's condition.  (Labs, imaging, consults)  Labs: I Ordered, and personally interpreted labs.  The pertinent results include: Elevated D-dimer, normal troponin  Imaging Studies ordered: I ordered imaging studies including chest x-ray, CT chest I independently visualized and interpreted imaging. I agree with the radiologist interpretation  Additional history obtained from  .  External records from outside source obtained and reviewed including    Cardiac Monitoring: The patient was maintained on a cardiac monitor.  If on the cardiac monitor, I personally viewed and interpreted the cardiac monitored which showed an underlying rhythm of: Sinus tachycardia  Reevaluation: After the interventions noted above, I reevaluated the patient and found that they  have :improved  Social Determinants of Health:    Disposition: Admit to hospital  Co morbidities that complicate the patient evaluation  Past Medical History:  Diagnosis Date   Premature baby      Medicines Meds ordered this encounter  Medications   iohexol  (OMNIPAQUE ) 350 MG/ML injection 80 mL   acetaminophen  (TYLENOL ) tablet 1,000 mg   sodium chloride  0.9 % bolus 1,000 mL   heparin  bolus via infusion 4,500 Units   heparin  ADULT infusion 100 units/mL (25000 units/250mL)    I have reviewed the patients home medicines and have made adjustments as needed  Problem List / ED Course: Problem List Items Addressed This Visit   None Visit Diagnoses       Multiple subsegmental pulmonary emboli without acute cor pulmonale (HCC)    -  Primary   Relevant Medications   heparin  bolus via infusion 4,500 Units (Completed)   heparin  ADULT infusion 100 units/mL (25000 units/250mL)                 Final diagnoses:  Multiple subsegmental pulmonary emboli without acute cor pulmonale [facility])    ED Discharge Orders     None          Lenor Hollering, MD 09/22/24 2322

## 2024-09-22 NOTE — Progress Notes (Signed)
 PHARMACY - ANTICOAGULATION CONSULT NOTE  Pharmacy Consult for heparin  Indication: pulmonary embolus  Allergies  Allergen Reactions   Shellfish-Derived Products Swelling    Patient Measurements:    Vital Signs: Temp: 100.5 F (38.1 C) (09/24 1947) Temp Source: Oral (09/24 1947) BP: 138/92 (09/24 1947) Pulse Rate: 113 (09/24 1947)  Labs: Recent Labs    09/22/24 1913  HGB 13.7  HCT 42.7  PLT 293  CREATININE 0.70    CrCl cannot be calculated (Unknown ideal weight.).   Medical History: Past Medical History:  Diagnosis Date   Premature baby      Assessment: 46 YOF presenting with SOB/CP, CT angio chest shows acute PE, no RHS.  She is not on anticoagulation PTA  Goal of Therapy:  Heparin  level 0.3-0.7 units/ml Monitor platelets by anticoagulation protocol: Yes   Plan:  Heparin  4500 units IV x 1, and gtt at 1350 units/hr F/u 6 hour heparin  level F/u long term [facility] plan  Dorn Poot, PharmD, [facility] Clinical Pharmacist ED Pharmacist Phone # [phone] 09/22/2024 9:30 PM

## 2024-09-22 NOTE — ED Notes (Signed)
 Report called to [facility] 2W and given to Evan

## 2024-09-22 NOTE — ED Triage Notes (Signed)
 Reports SHOB since this morning. No obvious signs of respiratory distress in triage. Denies chest pain, NV, cough, fevers  Reports L sided rib pain for 2 days, states she thinks pulled something after picking up water jug.

## 2024-09-23 ENCOUNTER — Inpatient Hospital Stay (HOSPITAL_COMMUNITY)

## 2024-09-23 DIAGNOSIS — R55 Syncope and collapse: Secondary | ICD-10-CM

## 2024-09-23 DIAGNOSIS — Z91013 Allergy to seafood: Secondary | ICD-10-CM | POA: Diagnosis not present

## 2024-09-23 DIAGNOSIS — Z1152 Encounter for screening for COVID-19: Secondary | ICD-10-CM | POA: Diagnosis not present

## 2024-09-23 DIAGNOSIS — T384X5A Adverse effect of oral contraceptives, initial encounter: Secondary | ICD-10-CM | POA: Diagnosis present

## 2024-09-23 DIAGNOSIS — R030 Elevated blood-pressure reading, without diagnosis of hypertension: Secondary | ICD-10-CM | POA: Diagnosis present

## 2024-09-23 DIAGNOSIS — Z6841 Body Mass Index (BMI) 40.0 and over, adult: Secondary | ICD-10-CM | POA: Diagnosis not present

## 2024-09-23 DIAGNOSIS — Z86711 Personal history of pulmonary embolism: Secondary | ICD-10-CM | POA: Diagnosis not present

## 2024-09-23 DIAGNOSIS — D72829 Elevated white blood cell count, unspecified: Secondary | ICD-10-CM | POA: Diagnosis present

## 2024-09-23 DIAGNOSIS — I2602 Saddle embolus of pulmonary artery with acute cor pulmonale: Secondary | ICD-10-CM | POA: Diagnosis not present

## 2024-09-23 DIAGNOSIS — Z833 Family history of diabetes mellitus: Secondary | ICD-10-CM | POA: Diagnosis not present

## 2024-09-23 DIAGNOSIS — Z8249 Family history of ischemic heart disease and other diseases of the circulatory system: Secondary | ICD-10-CM | POA: Diagnosis not present

## 2024-09-23 DIAGNOSIS — I2699 Other pulmonary embolism without acute cor pulmonale: Secondary | ICD-10-CM | POA: Diagnosis present

## 2024-09-23 DIAGNOSIS — E669 Obesity, unspecified: Secondary | ICD-10-CM | POA: Diagnosis present

## 2024-09-23 DIAGNOSIS — Z793 Long term (current) use of hormonal contraceptives: Secondary | ICD-10-CM | POA: Diagnosis not present

## 2024-09-23 DIAGNOSIS — R0902 Hypoxemia: Secondary | ICD-10-CM | POA: Diagnosis not present

## 2024-09-23 DIAGNOSIS — I2694 Multiple subsegmental pulmonary emboli without acute cor pulmonale: Secondary | ICD-10-CM | POA: Diagnosis present

## 2024-09-23 DIAGNOSIS — R000 Tachycardia, unspecified: Secondary | ICD-10-CM | POA: Diagnosis not present

## 2024-09-23 LAB — ECHOCARDIOGRAM COMPLETE
Area-P 1/2: 4.8 cm2
Calc EF: 67 %
Height: 64 in
S' Lateral: 2.7 cm
Single Plane A2C EF: 70.2 %
Single Plane A4C EF: 65 %
Weight: 3844.82 [oz_av]

## 2024-09-23 LAB — RESPIRATORY PANEL BY PCR

## 2024-09-23 LAB — HEPARIN LEVEL (UNFRACTIONATED)
Heparin Unfractionated: 0.41 [IU]/mL (ref 0.30–0.70)
Heparin Unfractionated: 0.37 [IU]/mL (ref 0.30–0.70)

## 2024-09-23 LAB — URINALYSIS, ROUTINE W REFLEX MICROSCOPIC
Bilirubin Urine: NEGATIVE
Glucose, UA: NEGATIVE mg/dL
Hgb urine dipstick: NEGATIVE
Ketones, ur: 20 mg/dL — AB
Leukocytes,Ua: NEGATIVE
Nitrite: NEGATIVE
Protein, ur: NEGATIVE mg/dL
Specific Gravity, Urine: 1.039 — ABNORMAL HIGH (ref 1.005–1.030)
pH: 6 (ref 5.0–8.0)

## 2024-09-23 LAB — COMPREHENSIVE METABOLIC PANEL WITH GFR
ALT: 8 U/L (ref 0–44)
AST: 11 U/L — ABNORMAL LOW (ref 15–41)
Albumin: 3.7 g/dL (ref 3.5–5.0)
Alkaline Phosphatase: 70 U/L (ref 38–126)
Anion gap: 14 (ref 5–15)
BUN: 6 mg/dL (ref 6–20)
CO2: 21 mmol/L — ABNORMAL LOW (ref 22–32)
Calcium: 9 mg/dL (ref 8.9–10.3)
Chloride: 104 mmol/L (ref 98–111)
Creatinine, Ser: 0.72 mg/dL (ref 0.44–1.00)
GFR, Estimated: >60 mL/min (ref >60)
Glucose, Bld: 99 mg/dL (ref 70–99)
Potassium: 3.9 mmol/L (ref 3.5–5.1)
Sodium: 138 mmol/L (ref 135–145)
Total Bilirubin: 0.4 mg/dL (ref 0.0–1.2)
Total Protein: 7.1 g/dL (ref 6.5–8.1)

## 2024-09-23 LAB — CBC
HCT: 38.9 % (ref 36.0–46.0)
Hemoglobin: 12.3 g/dL (ref 12.0–15.0)
MCH: 23.9 pg — ABNORMAL LOW (ref 26.0–34.0)
MCHC: 31.6 g/dL (ref 30.0–36.0)
MCV: 75.7 fL — ABNORMAL LOW (ref 80.0–100.0)
Platelets: 305 K/uL (ref 150–400)
RBC: 5.14 MIL/uL — ABNORMAL HIGH (ref 3.87–5.11)
RDW: 15.5 % (ref 11.5–15.5)
WBC: 14.5 K/uL — ABNORMAL HIGH (ref 4.0–10.5)
nRBC: 0 % (ref 0.0–0.2)

## 2024-09-23 LAB — PROCALCITONIN: Procalcitonin: <0.1 ng/mL

## 2024-09-23 LAB — SEDIMENTATION RATE: Sed Rate: 28 mm/h — ABNORMAL HIGH (ref 0–22)

## 2024-09-23 LAB — HIV ANTIBODY (ROUTINE TESTING W REFLEX): HIV Screen 4th Generation wRfx: NONREACTIVE

## 2024-09-23 LAB — GLUCOSE, CAPILLARY: Glucose-Capillary: 106 mg/dL — ABNORMAL HIGH (ref 70–99)

## 2024-09-23 LAB — MRSA NEXT GEN BY PCR, NASAL: MRSA by PCR Next Gen: NOT DETECTED

## 2024-09-23 MED ORDER — ACETAMINOPHEN 325 MG PO TABS
650.0000 mg | ORAL_TABLET | Freq: Four times a day (QID) | ORAL | Status: DC | PRN
  Administered 2024-09-23: 650 mg via ORAL
  Filled 2024-09-23 (×2): qty 2

## 2024-09-23 MED ORDER — ACETAMINOPHEN 650 MG RE SUPP: 650.0000 mg | Freq: Four times a day (QID) | RECTAL | Status: DC | PRN

## 2024-09-23 MED ORDER — ORAL CARE MOUTH RINSE: 15.0000 mL | OROMUCOSAL | Status: DC | PRN

## 2024-09-23 MED ORDER — SODIUM CHLORIDE 0.9 % IV SOLN: INTRAVENOUS | Status: DC

## 2024-09-23 MED ORDER — ONDANSETRON HCL 4 MG PO TABS: 4.0000 mg | ORAL_TABLET | Freq: Four times a day (QID) | ORAL | Status: DC | PRN

## 2024-09-23 MED ORDER — SODIUM CHLORIDE 0.9 % IV SOLN
100.0000 mg | Freq: Two times a day (BID) | INTRAVENOUS | Status: DC
  Administered 2024-09-23 – 2024-09-24 (×4): 100 mg via INTRAVENOUS
  Filled 2024-09-23 (×5): qty 100

## 2024-09-23 MED ORDER — ALBUTEROL SULFATE (2.5 MG/3ML) 0.083% IN NEBU: 2.5000 mg | INHALATION_SOLUTION | RESPIRATORY_TRACT | Status: DC | PRN

## 2024-09-23 MED ORDER — CHLORHEXIDINE GLUCONATE CLOTH 2 % EX PADS
6.0000 | MEDICATED_PAD | Freq: Every day | CUTANEOUS | Status: DC
  Administered 2024-09-23: 6 via TOPICAL

## 2024-09-23 MED ORDER — MORPHINE SULFATE (PF) 2 MG/ML IV SOLN
1.0000 mg | INTRAVENOUS | Status: DC | PRN
  Administered 2024-09-23 – 2024-09-26 (×5): 1 mg via INTRAVENOUS
  Filled 2024-09-23 (×7): qty 1

## 2024-09-23 MED ORDER — MELATONIN 5 MG PO TABS: 5.0000 mg | ORAL_TABLET | Freq: Every evening | ORAL | Status: DC | PRN

## 2024-09-23 MED ORDER — ONDANSETRON HCL 4 MG/2ML IJ SOLN: 4.0000 mg | Freq: Four times a day (QID) | INTRAMUSCULAR | Status: DC | PRN

## 2024-09-23 MED ORDER — HYDROCODONE-ACETAMINOPHEN 5-325 MG PO TABS
1.0000 | ORAL_TABLET | ORAL | Status: DC | PRN
  Administered 2024-09-23: 1 via ORAL
  Administered 2024-09-23: 2 via ORAL
  Administered 2024-09-23 (×2): 1 via ORAL
  Administered 2024-09-23 – 2024-09-27 (×14): 2 via ORAL
  Filled 2024-09-23 (×6): qty 2
  Filled 2024-09-23: qty 1
  Filled 2024-09-23 (×10): qty 2
  Filled 2024-09-23: qty 1
  Filled 2024-09-23: qty 2

## 2024-09-23 NOTE — Progress Notes (Signed)
 PHARMACY - ANTICOAGULATION CONSULT NOTE  Pharmacy Consult for heparin  Indication: pulmonary embolus  Allergies  Allergen Reactions   Shellfish-Derived Products Anaphylaxis    Patient Measurements: Height: 5' 4 (162.6 cm) Weight: 109 kg (240 lb 4.8 oz) IBW/kg (Calculated) : 54.7 HEPARIN  DW (KG): 80.6  Vital Signs: Temp: 98.1 F (36.7 C) (09/25 1138) Temp Source: Oral (09/25 1138) BP: 122/66 (09/25 0600) Pulse Rate: 88 (09/25 0600)  Labs: Recent Labs    09/22/24 1913 09/23/24 0414 09/23/24 1114  HGB 13.7 12.3  --   HCT 42.7 38.9  --   PLT 293 305  --   HEPARINUNFRC  --  0.41 0.37  CREATININE 0.70 0.72  --     Estimated Creatinine Clearance: 135.3 mL/min (by C-G formula based on SCr of 0.72 mg/dL).   Medical History: Past Medical History:  Diagnosis Date   Premature baby      Assessment: 43 YOF presenting with SOB/CP, CT angio chest shows acute PE, no RHS.  She is not on anticoagulation PTA  09/23/2024:  Confirmatory heparin  level 0.37-  remain therapeutic on IV heparin  1350 units/hr CBC: Hg, Plt WNL No bleeding or infusion related concerns reported by RN  Goal of Therapy:  Heparin  level 0.3-0.7 units/ml Monitor platelets by anticoagulation protocol: Yes   Plan:  Continue heparin  infusion at 1350 units/hr Daily heparin  level & CBC while on heparin  F/u long term AC plan  Rosaline IVAR Edison, Pharm.D Use secure chat for questions 09/23/2024 12:22 PM

## 2024-09-23 NOTE — Progress Notes (Signed)
 [facility] PROGRESS NOTE   Pamela Berg FMW:[id number] DOB: 08/23/2001 DOA: 09/22/2024  PCP: Kline, Chianne, PA-C  Brief History: 23 y.o. female with  no significant past medical history who presents to [facility] with complaint of shortness of breath x 1 day.  No recent travel.  No sick contacts.  No hospitalizations.  She does take oral contraceptives on a daily basis.  Patient was evaluated in the ED and underwent CT angiogram of her chest which showed acute pulmonary embolism.  She was hospitalized for further management.   Consultants: Pulmonology  Procedures: Echocardiogram is pending.  Lower extremity Doppler studies pending.    Subjective/Interval History: Patient complains of 5 out of 10 pain in the left chest.  Some shortness of breath.  No nausea or vomiting.  Complains of leg swelling specially in the left leg.  She also mentions that her mother has had blood clots previously and she thinks her grandmother also had blood clots previously.    Assessment/Plan:  Acute pulmonary embolism/syncope Moderate clot burden noted on CT angiogram.  Probable area of evolving pulmonary infarct was also noted.  Patient was noted to have leukocytosis.  Empirically on doxycycline . The only risk factor currently identified is use of oral contraceptives.  However it also appears that there may be a family history of thromboembolism.  She mentioned that her mother underwent genetic testing which was inconclusive.  Patient is currently on IV heparin .  Lower extremity Doppler study and echocardiogram are pending. Pulmonology has been consulted. She will benefit from being referred to hematology at discharge. Oral contraceptives have to be discontinued going forward.  This was explained to the patient. Patient also had an episode of syncope.  Currently stable.  Noted to be mildly tachycardic with normal blood pressures.  Obesity Estimated body mass index is 41.25 kg/m as calculated from  the following:   Height as of this encounter: 5' 4 (1.626 m).   Weight as of this encounter: 109 kg.   DVT Prophylaxis: On IV heparin  Code Status: Full code Family Communication: Discussed with patient.  Will update her parents later today Disposition Plan: Hopefully return home when improved  Status is: Inpatient Remains inpatient appropriate because: Acute pulmonary embolism requiring IV heparin  and further workup    Medications: Scheduled:  Chlorhexidine  Gluconate Cloth  6 each Topical Daily   Continuous:  sodium chloride  75 mL/hr at 09/23/24 0819   doxycycline  (VIBRAMYCIN ) IV 100 mg (09/23/24 0901)   heparin  1,350 Units/hr (09/23/24 0155)   PRN:acetaminophen  **OR** acetaminophen , albuterol , HYDROcodone -acetaminophen , melatonin, morphine  injection, ondansetron  **OR** ondansetron  (ZOFRAN ) IV, mouth rinse  Antibiotics: Anti-infectives (From admission, onward)    Start     Dose/Rate Route Frequency Ordered Stop   09/23/24 0800  doxycycline  (VIBRAMYCIN ) 100 mg in sodium chloride  0.9 % 250 mL IVPB        100 mg 125 mL/hr over 120 Minutes Intravenous Every 12 hours 09/23/24 0705         Objective:  Vital Signs  Vitals:   09/23/24 0535 09/23/24 0540 09/23/24 0600 09/23/24 0728  BP:  (!) 147/78 122/66   Pulse: (S) (!) 42 (!) 102 88   Resp:  (!) 25 (!) 21   Temp:    98.8 F (37.1 C)  TempSrc:    Oral  SpO2: 97% 98% 98%   Weight:      Height:        Intake/Output Summary (Last 24 hours) at 09/23/2024 0940 Last data filed at 09/23/2024 0155 Gross per  24 hour  Intake 1100.02 ml  Output --  Net 1100.02 ml   Filed Weights   09/23/24 0010 09/23/24 0029  Weight: 109 kg 109 kg    General appearance: Awake alert.  In no distress Resp: Mildly tachypneic without use of accessory muscles.  Diminished air entry at the bases.  No wheezing or rhonchi. Cardio: S1-S2 is tachycardic regular.  No S3-S4.  No rubs murmurs or bruit GI: Abdomen is soft.  Nontender nondistended.   Bowel sounds are present normal.  No masses organomegaly Extremities: Mild edema bilateral lower extremities but no significant unequal swelling is noted. Neurologic: Alert and oriented x3.  No focal neurological deficits.    Lab Results:  Data Reviewed: I have personally reviewed following labs and reports of the imaging studies  CBC: Recent Labs  Lab 09/22/24 1913 09/23/24 0414  WBC 12.4* 14.5*  NEUTROABS 8.9*  --   HGB 13.7 12.3  HCT 42.7 38.9  MCV 74.5* 75.7*  PLT 293 305    Basic Metabolic Panel: Recent Labs  Lab 09/22/24 1913 09/23/24 0414  NA 138 138  K 4.6 3.9  CL 104 104  CO2 21* 21*  GLUCOSE 92 99  BUN 9 6  CREATININE 0.70 0.72  CALCIUM 9.4 9.0    GFR: Estimated Creatinine Clearance: 135.3 mL/min (by C-G formula based on SCr of 0.72 mg/dL).  Liver Function Tests: Recent Labs  Lab 09/23/24 0414  AST 11*  ALT 8  ALKPHOS 70  BILITOT 0.4  PROT 7.1  ALBUMIN 3.7    BNP (last 3 results) Recent Labs    09/22/24 2214  PROBNP <50.0   CBG: Recent Labs  Lab 09/23/24 0537  GLUCAP 106*    Recent Results (from the past 240 hours)  Resp panel by RT-PCR (RSV, Flu A&B, Covid) Anterior Nasal Swab     Status: None   Collection Time: 09/22/24  9:03 PM   Specimen: Anterior Nasal Swab  Result Value Ref Range Status   SARS Coronavirus 2 by RT PCR NEGATIVE NEGATIVE Final    Comment: (NOTE) SARS-CoV-2 target nucleic acids are NOT DETECTED.  The SARS-CoV-2 RNA is generally detectable in upper respiratory specimens during the acute phase of infection. The lowest concentration of SARS-CoV-2 viral copies this assay can detect is 138 copies/mL. A negative result does not preclude SARS-Cov-2 infection and should not be used as the sole basis for treatment or other patient management decisions. A negative result may occur with  improper specimen collection/handling, submission of specimen other than nasopharyngeal swab, presence of viral mutation(s) within  the areas targeted by this assay, and inadequate number of viral copies(<138 copies/mL). A negative result must be combined with clinical observations, patient history, and epidemiological information. The expected result is Negative.  Fact Sheet for Patients:  BloggerCourse.com  Fact Sheet for Healthcare Providers:  SeriousBroker.it  This test is no t yet approved or cleared by the [place]  FDA and  has been authorized for detection and/or diagnosis of SARS-CoV-2 by FDA under an Emergency Use Authorization (EUA). This EUA will remain  in effect (meaning this test can be used) for the duration of the COVID-19 declaration under Section 564(b)(1) of the Act, 21 U.S.C.section 360bbb-3(b)(1), unless the authorization is terminated  or revoked sooner.       Influenza A by PCR NEGATIVE NEGATIVE Final   Influenza B by PCR NEGATIVE NEGATIVE Final    Comment: (NOTE) The Xpert Xpress SARS-CoV-2/FLU/RSV plus assay is intended as an aid in the  diagnosis of influenza from Nasopharyngeal swab specimens and should not be used as a sole basis for treatment. Nasal washings and aspirates are unacceptable for Xpert Xpress SARS-CoV-2/FLU/RSV testing.  Fact Sheet for Patients: BloggerCourse.com  Fact Sheet for Healthcare Providers: SeriousBroker.it  This test is not yet approved or cleared by the [place]  FDA and has been authorized for detection and/or diagnosis of SARS-CoV-2 by FDA under an Emergency Use Authorization (EUA). This EUA will remain in effect (meaning this test can be used) for the duration of the COVID-19 declaration under Section 564(b)(1) of the Act, 21 U.S.C. section 360bbb-3(b)(1), unless the authorization is terminated or revoked.     Resp Syncytial Virus by PCR NEGATIVE NEGATIVE Final    Comment: (NOTE) Fact Sheet for  Patients: BloggerCourse.com  Fact Sheet for Healthcare Providers: SeriousBroker.it  This test is not yet approved or cleared by the [place]  FDA and has been authorized for detection and/or diagnosis of SARS-CoV-2 by FDA under an Emergency Use Authorization (EUA). This EUA will remain in effect (meaning this test can be used) for the duration of the COVID-19 declaration under Section 564(b)(1) of the Act, 21 U.S.C. section 360bbb-3(b)(1), unless the authorization is terminated or revoked.  Performed at [facility], [address] [place], [place] [address]   MRSA Next Gen by PCR, Nasal     Status: None   Collection Time: 09/23/24 12:14 AM   Specimen: Nasal Mucosa; Nasal Swab  Result Value Ref Range Status   MRSA by PCR Next Gen NOT DETECTED NOT DETECTED Final    Comment: (NOTE) The GeneXpert MRSA Assay (FDA approved for NASAL specimens only), is one component of a comprehensive MRSA colonization surveillance program. It is not intended to diagnose MRSA infection nor to guide or monitor treatment for MRSA infections. Test performance is not FDA approved in patients less than 85 years old. Performed at [facility], [address], [place], [place] [address]       Radiology Studies: CT Angio Chest PE W/Cm &/Or Wo Cm Result Date: 09/22/2024 EXAM: CTA CHEST 09/22/2024 08:54:38 PM TECHNIQUE: CTA of the chest was performed after the administration of intravenous contrast (iohexol  (OMNIPAQUE ) 350 MG/ML injection 80 mL IOHEXOL  350 MG/ML SOLN). Multiplanar reformatted images are provided for review. MIP images are provided for review. Automated exposure control, iterative reconstruction, and/or weight based adjustment of the mA/kV was utilized to reduce the radiation dose to as low as reasonably achievable. COMPARISON: None available. CLINICAL HISTORY: Pulmonary embolism (PE) suspected, low to  intermediate prob, positive D-dimer. PE suspected, elevated Ddimer, lower leg and feet swelling, tachycardic. FINDINGS: PULMONARY ARTERIES: There are branching intraluminal filling defects identified within the lobar and segmental pulmonary arteries of the lower lobes bilaterally in keeping with acute pulmonary emboli. The embolic burden is moderate. MEDIASTINUM: No CT evidence of right heart strain. No significant coronary artery calcification. Global cardiac size within normal limits. No pericardial effusion. Thoracic Aorta is unremarkable. LYMPH NODES: No pathologic thoracic adenopathy. LUNGS AND PLEURA: Patchy ground glass opacity within the basilar lingula, which may represent atelectasis or infiltrate in the setting of a developing pulmonary infarct. Bibasilar atelectasis noted. No pneumothorax or pleural effusion. No central obstructing lesion. UPPER ABDOMEN: Limited images of the upper abdomen are unremarkable. SOFT TISSUES AND BONES: No acute bone abnormality. No lytic or blastic bone lesion. IMPRESSION: 1. Acute pulmonary emboli involving the lobar and segmental pulmonary arteries of the lower lobes bilaterally, with moderate embolic burden. No CT evidence  of right heart strain. 2. Patchy ground glass opacity within the basilar lingula, possibly representing atelectasis or infiltrate in the setting of a developing pulmonary infarct.\ 3. Findings were discussed personally with Dr. Lenor at time of dictation Electronically signed by: Dorethia Molt MD 09/22/2024 09:16 PM EDT RP Workstation: [id number]   DG Chest 2 View Result Date: 09/22/2024 CLINICAL DATA:  SHOB EXAM: CHEST - 2 VIEW COMPARISON:  None available. FINDINGS: Lower lung volumes. Bilateral perihilar interstitial opacities. No focal airspace consolidation, pleural effusion, or pneumothorax. No cardiomegaly.No acute fracture or destructive lesion. IMPRESSION: Bilateral perihilar interstitial opacities, which may represent bronchovascular crowding  due to low lung volumes, atypical/viral infection, or changes of asthma. Electronically Signed   By: Rogelia Myers M.D.   On: 09/22/2024 17:29       LOS: 0 days   Marley Charlot Verdene  [facility] Pager on [email or web address]  09/23/2024, 9:40 AM

## 2024-09-23 NOTE — Progress Notes (Signed)
 BLE venous duplex has been completed.   Results can be found under chart review under CV PROC. 09/23/2024 11:31 AM Makeyla Govan RVT, RDMS

## 2024-09-23 NOTE — H&P (Addendum)
 History and Physical    Pamela Berg FMW:[id number] DOB: Dec 18, 2001 DOA: 09/22/2024  PCP: Kline, Chianne, PA-C  Patient coming from: [facility]   I have personally briefly reviewed patient's old medical records in [facility]  Chief Complaint:   HPI: Pamela Berg is a 23 y.o. female with  no significant past medical history who presents to [facility] with complaint of shortness of breath x 1 day.Patient notes no associated n/v / cough marvina /chills  or chest pain, she does however note feeling congested. She however has noted left sided rib pain for the last 2 days which she ascribes to strained muscle s/p lifting heavy object.     ED Course:  Tmx:100.5, bp 139/75 ( 159/105), hr 126, rr 24, sat 98% on ra  HCG:neg  EKG: sinus tachycardia   Wbc 12.4, hgb 13.7, plt 293  D-dimer 10.54 Nqa 138, k 4.6, Cl 104, bicarb 21, glu 92, Cr 0.7 CE <15  RVP:neg CXR IMPRESSION: Bilateral perihilar interstitial opacities, which may represent bronchovascular crowding due to low lung volumes, atypical/viral infection, or changes of asthma.  CE <15  CTPE: IMPRESSION: 1. Acute pulmonary emboli involving the lobar and segmental pulmonary arteries of the lower lobes bilaterally, with moderate embolic burden. No CT evidence of right heart strain. 2. Patchy ground glass opacity within the basilar lingula, possibly representing atelectasis or infiltrate in the setting of a developing pulmonary infarct.\  Tx: tylenol  , NS 1L, heparin  drip    Of note patient course complicated on floor by vasovagal episode after using bathroom. Patient fully recovered.  Review of Systems: As per HPI otherwise 10 point review of systems negative.   Past Medical History:  Diagnosis Date   Premature baby     History reviewed. No pertinent surgical history.   reports that she has never smoked. She has never been exposed to tobacco smoke. She has never used smokeless tobacco. She reports that she  does not drink alcohol and does not use drugs.  Allergies  Allergen Reactions   Shellfish-Derived Products Swelling    Family History  Problem Relation Age of Onset   Depression Mother    ADD / ADHD Mother    Hypertension Mother    Vision loss Mother    Cancer Maternal Aunt    Diabetes Maternal Grandmother    Arthritis Maternal Grandmother    Hypertension Maternal Grandmother    Miscarriages / Stillbirths Maternal Grandmother    Diabetes Maternal Grandfather    Stroke Maternal Grandfather    Arthritis Maternal Grandfather    Hypertension Maternal Grandfather     Prior to Admission medications   Medication Sig Start Date End Date Taking? Authorizing Provider  norethindrone-ethinyl estradiol-iron (JUNEL FE 1.5/30) 1.5-30 MG-MCG tablet Take 1 tablet by mouth daily. 03/18/23   Dorrene Nest, MD    Physical Exam: Vitals:   09/22/24 2230 09/22/24 2300 09/23/24 0010 09/23/24 0029  BP: (!) 142/94 (!) 135/111  (!) 158/105  Pulse: (!) 115 (!) 114  (!) 122  Resp: (!) 21 (!) 23  (!) 27  Temp:   99.3 F (37.4 C)   TempSrc:   Oral   SpO2: 97% 99%    Weight:    109 kg  Height:   5' 4 (1.626 m)     Constitutional: NAD, calm, comfortable Vitals:   09/22/24 2230 09/22/24 2300 09/23/24 0010 09/23/24 0029  BP: (!) 142/94 (!) 135/111  (!) 158/105  Pulse: (!) 115 (!) 114  (!) 122  Resp: (!) 21 (!) 23  (!) 27  Temp:   99.3 F (37.4 C)   TempSrc:   Oral   SpO2: 97% 99%    Weight:    109 kg  Height:   5' 4 (1.626 m)    Physical Exam HENT:     Head: Normocephalic and atraumatic.  Cardiovascular:     Rate and Rhythm: Tachycardia present.  Pulmonary:     Breath sounds: No decreased breath sounds, wheezing, rhonchi or rales.  Musculoskeletal:     Cervical back: Normal range of motion and neck supple.     Right lower leg: No tenderness.  Neurological:     General: No focal deficit present.     Mental Status: She is alert.     Labs on Admission: I have personally reviewed  following labs and imaging studies  CBC: Recent Labs  Lab 09/22/24 1913  WBC 12.4*  NEUTROABS 8.9*  HGB 13.7  HCT 42.7  MCV 74.5*  PLT 293   Basic Metabolic Panel: Recent Labs  Lab 09/22/24 1913  NA 138  K 4.6  CL 104  CO2 21*  GLUCOSE 92  BUN 9  CREATININE 0.70  CALCIUM 9.4   GFR: Estimated Creatinine Clearance: 135.3 mL/min (by C-G formula based on SCr of 0.7 mg/dL). Liver Function Tests: No results for input(s): AST, ALT, ALKPHOS, BILITOT, PROT, ALBUMIN in the last 168 hours. No results for input(s): LIPASE, AMYLASE in the last 168 hours. No results for input(s): AMMONIA in the last 168 hours. Coagulation Profile: No results for input(s): INR, PROTIME in the last 168 hours. Cardiac Enzymes: No results for input(s): CKTOTAL, CKMB, CKMBINDEX, TROPONINI in the last 168 hours. BNP (last 3 results) Recent Labs    09/22/24 2214  PROBNP <50.0   HbA1C: No results for input(s): HGBA1C in the last 72 hours. CBG: No results for input(s): GLUCAP in the last 168 hours. Lipid Profile: No results for input(s): CHOL, HDL, LDLCALC, TRIG, CHOLHDL, LDLDIRECT in the last 72 hours. Thyroid Function Tests: No results for input(s): TSH, T4TOTAL, FREET4, T3FREE, THYROIDAB in the last 72 hours. Anemia Panel: No results for input(s): VITAMINB12, FOLATE, FERRITIN, TIBC, IRON, RETICCTPCT in the last 72 hours. Urine analysis: No results found for: COLORURINE, APPEARANCEUR, LABSPEC, PHURINE, GLUCOSEU, HGBUR, BILIRUBINUR, KETONESUR, PROTEINUR, UROBILINOGEN, NITRITE, LEUKOCYTESUR  Radiological Exams on Admission: CT Angio Chest PE W/Cm &/Or Wo Cm Result Date: 09/22/2024 EXAM: CTA CHEST 09/22/2024 08:54:38 PM TECHNIQUE: CTA of the chest was performed after the administration of intravenous contrast (iohexol  (OMNIPAQUE ) 350 MG/ML injection 80 mL IOHEXOL  350 MG/ML SOLN). Multiplanar reformatted images  are provided for review. MIP images are provided for review. Automated exposure control, iterative reconstruction, and/or weight based adjustment of the mA/kV was utilized to reduce the radiation dose to as low as reasonably achievable. COMPARISON: None available. CLINICAL HISTORY: Pulmonary embolism (PE) suspected, low to intermediate prob, positive D-dimer. PE suspected, elevated Ddimer, lower leg and feet swelling, tachycardic. FINDINGS: PULMONARY ARTERIES: There are branching intraluminal filling defects identified within the lobar and segmental pulmonary arteries of the lower lobes bilaterally in keeping with acute pulmonary emboli. The embolic burden is moderate. MEDIASTINUM: No CT evidence of right heart strain. No significant coronary artery calcification. Global cardiac size within normal limits. No pericardial effusion. Thoracic Aorta is unremarkable. LYMPH NODES: No pathologic thoracic adenopathy. LUNGS AND PLEURA: Patchy ground glass opacity within the basilar lingula, which may represent atelectasis or infiltrate in the setting of a developing pulmonary infarct. Bibasilar atelectasis  noted. No pneumothorax or pleural effusion. No central obstructing lesion. UPPER ABDOMEN: Limited images of the upper abdomen are unremarkable. SOFT TISSUES AND BONES: No acute bone abnormality. No lytic or blastic bone lesion. IMPRESSION: 1. Acute pulmonary emboli involving the lobar and segmental pulmonary arteries of the lower lobes bilaterally, with moderate embolic burden. No CT evidence of right heart strain. 2. Patchy ground glass opacity within the basilar lingula, possibly representing atelectasis or infiltrate in the setting of a developing pulmonary infarct.\ 3. Findings were discussed personally with Dr. Lenor at time of dictation Electronically signed by: Dorethia Molt MD 09/22/2024 09:16 PM EDT RP Workstation: [id number]   DG Chest 2 View Result Date: 09/22/2024 CLINICAL DATA:  SHOB EXAM: CHEST - 2 VIEW  COMPARISON:  None available. FINDINGS: Lower lung volumes. Bilateral perihilar interstitial opacities. No focal airspace consolidation, pleural effusion, or pneumothorax. No cardiomegaly.No acute fracture or destructive lesion. IMPRESSION: Bilateral perihilar interstitial opacities, which may represent bronchovascular crowding due to low lung volumes, atypical/viral infection, or changes of asthma. Electronically Signed   By: Rogelia Myers M.D.   On: 09/22/2024 17:29    EKG: Independently reviewed.   Assessment/Plan  Pulmonary Emboli without signs of right heart strain  Probable area of evolving pulmonary infarct  -noted moderate burden -in setting of Oral contraceptive use  - admit to progressive care  -continue on heparin  drip -echo in am  -dopplers lower ext pending  -consider pulmonary consult in am   Vasovagal episode -s/p using bathroom  - echo pending  - continue with ivfs  -best rest for now    Ground glass infiltrate  - + fever/ noted  feeling of congestion/elevated wbc  cannot r/o infection  - place on doxycycline  for now  - can de-escalate as able  DVT prophylaxis: heparin  drip Code Status: full/ as discussed per patient wishes in event of cardiac arrest  Family Communication: none at bedside Disposition Plan: full/ as discussed per patient wishes in event of cardiac arrest  Consults called: please call Pulmonary consult in am  Admission status: progressive care   Camila DELENA Ned MD [facility]  If 7PM-7AM, please contact night-coverage [email or web address] Password [email or web address]  09/23/2024, 1:04 AM

## 2024-09-23 NOTE — Plan of Care (Signed)

## 2024-09-23 NOTE — Progress Notes (Signed)
  Echocardiogram 2D Echocardiogram has been performed.  Pamela Berg 09/23/2024, 2:56 PM

## 2024-09-23 NOTE — Progress Notes (Signed)
 PHARMACY - ANTICOAGULATION CONSULT NOTE  Pharmacy Consult for heparin  Indication: pulmonary embolus  Allergies  Allergen Reactions   Shellfish-Derived Products Swelling    Patient Measurements: Height: 5' 4 (162.6 cm) Weight: 109 kg (240 lb 4.8 oz) IBW/kg (Calculated) : 54.7 HEPARIN  DW (KG): 80.6  Vital Signs: Temp: 99.3 F (37.4 C) (09/25 0010) Temp Source: Oral (09/25 0010) BP: 113/82 (09/25 0400) Pulse Rate: 97 (09/25 0400)  Labs: Recent Labs    09/22/24 1913 09/23/24 0414  HGB 13.7 12.3  HCT 42.7 38.9  PLT 293 305  HEPARINUNFRC  --  0.41  CREATININE 0.70  --     Estimated Creatinine Clearance: 135.3 mL/min (by C-G formula based on SCr of 0.7 mg/dL).   Medical History: Past Medical History:  Diagnosis Date   Premature baby      Assessment: 51 YOF presenting with SOB/CP, CT angio chest shows acute PE, no RHS.  She is not on anticoagulation PTA  09/23/2024: Initial heparin  level 0.41- therapeutic on IV heparin  1350 units/hr CBC: Hg, Plt WNL No bleeding or infusion related concerns reported by RN  Goal of Therapy:  Heparin  level 0.3-0.7 units/ml Monitor platelets by anticoagulation protocol: Yes   Plan:  Continue heparin  infusion at 1350 units/hr Check confirmatory heparin  level at 1000 Daily heparin  level & CBC while on heparin  F/u long term [facility] plan  Rosaline Millet, PharmD, BCPS 09/23/2024 4:57 AM

## 2024-09-23 NOTE — Consult Note (Signed)
 NAME:  Pamela Berg, MRN:  [id number], DOB:  [birth year], LOS: 0 ADMISSION DATE:  09/22/2024, CONSULTATION DATE:  09/23/24  REFERRING MD:  TRH, CHIEF COMPLAINT:  chest pain   History of Present Illness:  23 year old woman presented due to shortness of breath found to have pulmonary embolism, low risk based on CT and serologic data.  Multiple ED notes and H&P reviewed.  Patient reported shortness of breath in the ED.  Preceded by left chest and left upper back pain for 2 days.  Pain was pleuritic in nature and positional.  She thought maybe related to picking up heavy object and muscle discomfort.  On review of systems she endorsed increased left leg swelling compared to the right.  She endorsed taking OCPs.  Check x-ray clear on my review interpretation.  CT angio PE protocol of the chest revealed bilateral pulmonary emboli.  No sign of right ventricular enlargement.  Troponin within normal limits.  BNP within normal notes.  She is tachycardic, requiring 2 L nasal cannula.  Although there is no true hypoxemia demonstrated, she was tacky to the high 90s to 100% on room air prior to 2 L being applied.  Early this morning, 2 episode sounds like syncope or presyncope.  Per nurse report after ambulated from bathroom she sat down and laid in the bed.  Then heart rate decreased.  She quickly regained consciousness it seems.  Pertinent  Medical History    Significant Hospital Events: Including procedures, antibiotic start and stop dates in addition to other pertinent events     Interim History / Subjective:    Objective    Blood pressure 122/66, pulse 88, temperature 98.8 F (37.1 C), temperature source Oral, resp. rate (!) 21, height 5' 4 (1.626 m), weight 109 kg, last menstrual period 09/02/2024, SpO2 98%.        Intake/Output Summary (Last 24 hours) at 09/23/2024 0759 Last data filed at 09/23/2024 0155 Gross per 24 hour  Intake 1100.02 ml  Output --  Net 1100.02 ml   Filed Weights    09/23/24 0010 09/23/24 0029  Weight: 109 kg 109 kg    Examination: General: Lying in bed in no acute distress HENT: Atraumatic normocephalic Lungs: Normal work of breathing on 2 L nasal cannula Cardiovascular: Tachycardic, no edema Abdomen: Nondistended Neuro: Alert oriented no deficits   Resolved problem list   Assessment and Plan   Low risk pulmonary embolism: Troponin, BNP within normal limits.  No sign of RV strain on CT scan.  Presumed provoked in setting of OCP use, additional risk factor is obesity. -- Will check TTE out of abundance of caution to evaluate for possible RV dysfunction and further follow-up if needed -- Continue heparin  GTT, consider transition to Eliquis in the next 24 to 48 hours -- Given family history, I agree/recommend outpatient referral to hematology for further evaluation, testing, and determination of treatment duration, at minimum 3 to 6 months  Syncope versus presyncope: Sounds vasovagal with decrease in heart rate around the time of micturition.  Likely exacerbated by many hours without any food or drink.  With obstructive process or if related to PE, would expect heart rate to actually increase and then lead to syncope.  Therefore I do not think PE is directly related to this event. -- Ensure adequate hydration, encourage p.o. intake.  PCCM is available as needed.  If TTE results with any signs of RV or RA enlargement or RV dysfunction or elevated pulmonary or right reticular pressures please contact  us  and we can arrange outpatient pulmonary follow-up.  Otherwise, no need for pulmonary follow-up with a low risk PE.  Labs   CBC: Recent Labs  Lab 09/22/24 1913 09/23/24 0414  WBC 12.4* 14.5*  NEUTROABS 8.9*  --   HGB 13.7 12.3  HCT 42.7 38.9  MCV 74.5* 75.7*  PLT 293 305    Basic Metabolic Panel: Recent Labs  Lab 09/22/24 1913 09/23/24 0414  NA 138 138  K 4.6 3.9  CL 104 104  CO2 21* 21*  GLUCOSE 92 99  BUN 9 6  CREATININE 0.70 0.72   CALCIUM 9.4 9.0   GFR: Estimated Creatinine Clearance: 135.3 mL/min (by C-G formula based on SCr of 0.72 mg/dL). Recent Labs  Lab 09/22/24 1913 09/23/24 0414  PROCALCITON  --  <0.10  WBC 12.4* 14.5*    Liver Function Tests: Recent Labs  Lab 09/23/24 0414  AST 11*  ALT 8  ALKPHOS 70  BILITOT 0.4  PROT 7.1  ALBUMIN 3.7   No results for input(s): LIPASE, AMYLASE in the last 168 hours. No results for input(s): AMMONIA in the last 168 hours.  ABG No results found for: PHART, PCO2ART, PO2ART, HCO3, TCO2, ACIDBASEDEF, O2SAT   Coagulation Profile: No results for input(s): INR, PROTIME in the last 168 hours.  Cardiac Enzymes: No results for input(s): CKTOTAL, CKMB, CKMBINDEX, TROPONINI in the last 168 hours.  HbA1C: No results found for: HGBA1C  CBG: Recent Labs  Lab 09/23/24 0537  GLUCAP 106*    Review of Systems:   As per HPI otherwise comprehensive review of systems negative  Past Medical History:  She,  has a past medical history of Premature baby.   Surgical History:  History reviewed. No pertinent surgical history.   Social History:   reports that she has never smoked. She has never been exposed to tobacco smoke. She has never used smokeless tobacco. She reports that she does not drink alcohol and does not use drugs.   Family History:  Her family history includes ADD / ADHD in her mother; Arthritis in her maternal grandfather and maternal grandmother; Cancer in her maternal aunt; Depression in her mother; Diabetes in her maternal grandfather and maternal grandmother; Hypertension in her maternal grandfather, maternal grandmother, and mother; Miscarriages / Stillbirths in her maternal grandmother; Stroke in her maternal grandfather; Vision loss in her mother.   Allergies Allergies  Allergen Reactions   Shellfish-Derived Products Swelling     Home Medications  Prior to Admission medications   Medication Sig Start Date  End Date Taking? Authorizing Provider  norethindrone-ethinyl estradiol-iron (JUNEL FE 1.5/30) 1.5-30 MG-MCG tablet Take 1 tablet by mouth daily. 03/18/23   Dorrene Nest, MD     Critical care time: n/a     Donnice JONELLE Beals, MD See TRACEY

## 2024-09-24 DIAGNOSIS — I2699 Other pulmonary embolism without acute cor pulmonale: Secondary | ICD-10-CM | POA: Diagnosis not present

## 2024-09-24 LAB — CBC
HCT: 36.5 % (ref 36.0–46.0)
Hemoglobin: 11.2 g/dL — ABNORMAL LOW (ref 12.0–15.0)
MCH: 23.8 pg — ABNORMAL LOW (ref 26.0–34.0)
MCHC: 30.7 g/dL (ref 30.0–36.0)
MCV: 77.7 fL — ABNORMAL LOW (ref 80.0–100.0)
Platelets: 293 K/uL (ref 150–400)
RBC: 4.7 MIL/uL (ref 3.87–5.11)
RDW: 15.5 % (ref 11.5–15.5)
WBC: 12.3 K/uL — ABNORMAL HIGH (ref 4.0–10.5)
nRBC: 0 % (ref 0.0–0.2)

## 2024-09-24 LAB — HEPARIN LEVEL (UNFRACTIONATED)
Heparin Unfractionated: 0.21 [IU]/mL — ABNORMAL LOW (ref 0.30–0.70)
Heparin Unfractionated: 0.45 [IU]/mL (ref 0.30–0.70)

## 2024-09-24 LAB — BASIC METABOLIC PANEL WITH GFR
Anion gap: 10 (ref 5–15)
BUN: <5 mg/dL — ABNORMAL LOW (ref 6–20)
CO2: 21 mmol/L — ABNORMAL LOW (ref 22–32)
Calcium: 8.6 mg/dL — ABNORMAL LOW (ref 8.9–10.3)
Chloride: 107 mmol/L (ref 98–111)
Creatinine, Ser: 0.74 mg/dL (ref 0.44–1.00)
GFR, Estimated: >60 mL/min (ref >60)
Glucose, Bld: 91 mg/dL (ref 70–99)
Potassium: 4.1 mmol/L (ref 3.5–5.1)
Sodium: 138 mmol/L (ref 135–145)

## 2024-09-24 LAB — MAGNESIUM: Magnesium: 2 mg/dL (ref 1.7–2.4)

## 2024-09-24 MED ORDER — SERTRALINE HCL 25 MG PO TABS
25.0000 mg | ORAL_TABLET | Freq: Every day | ORAL | Status: DC
  Administered 2024-09-24 – 2024-09-27 (×4): 25 mg via ORAL
  Filled 2024-09-24 (×4): qty 1

## 2024-09-24 MED ORDER — HEPARIN BOLUS VIA INFUSION
1200.0000 [IU] | Freq: Once | INTRAVENOUS | Status: AC
  Administered 2024-09-24: 1200 [IU] via INTRAVENOUS
  Filled 2024-09-24: qty 1200

## 2024-09-24 NOTE — Progress Notes (Signed)
   09/24/24 1507  TOC Brief Assessment  Insurance and Status Reviewed  Patient has primary care physician Yes  Home environment has been reviewed Single family home  Prior level of function: Independent with ADL's  Prior/Current Home Services No current home services  Social Drivers of Health Review SDOH reviewed no interventions necessary  Readmission risk has been reviewed Yes  Transition of care needs no transition of care needs at this time

## 2024-09-24 NOTE — Progress Notes (Signed)
   09/24/24 2155  Assess: MEWS Score  Temp 99.3 F (37.4 C)  BP (!) 155/110  MAP (mmHg) 123  Pulse Rate (!) 116  Resp 18  SpO2 97 %  O2 Device Nasal Cannula  O2 Flow Rate (L/min) 3 L/min  Assess: MEWS Score  MEWS Temp 0  MEWS Systolic 0  MEWS Pulse 2  MEWS RR 0  MEWS LOC 0  MEWS Score 2  MEWS Score Color Yellow  Assess: if the MEWS score is Yellow or Red  Were vital signs accurate and taken at a resting state? Yes  Does the patient meet 2 or more of the SIRS criteria? No  MEWS guidelines implemented  No, previously yellow, continue vital signs every 4 hours  Notify: Charge Nurse/RN  Name of Charge Nurse/RN Orthoptist, RN  Assess: SIRS CRITERIA  SIRS Temperature  0  SIRS Respirations  0  SIRS Pulse 1  SIRS WBC 0  SIRS Score Sum  1

## 2024-09-24 NOTE — Plan of Care (Signed)

## 2024-09-24 NOTE — Progress Notes (Signed)
 [facility] PROGRESS NOTE   Pamela Berg FMW:[id number] DOB: January 20, 2001 DOA: 09/22/2024  PCP: Kline, Chianne, PA-C  Brief History: 23 y.o. female with  no significant past medical history who presents to [facility] with complaint of shortness of breath x 1 day.  No recent travel.  No sick contacts.  No hospitalizations.  She does take oral contraceptives on a daily basis.  Patient was evaluated in the ED and underwent CT angiogram of her chest which showed acute pulmonary embolism.  She was hospitalized for further management.   Consultants: Pulmonology  Procedures: Echocardiogram.  Lower extremity Doppler.    Subjective/Interval History: Patient still with about 4 out of 10 pain in the left chest but not continuously.  Currently she is pain-free.  Shortness of breath with minimal exertion.  No dizziness or lightheadedness.      Assessment/Plan:  Acute pulmonary embolism/syncope Moderate clot burden noted on CT angiogram.  Probable area of evolving pulmonary infarct was also noted.  Patient was noted to have leukocytosis.  Empirically on doxycycline .  Will give a 5-day course.  Change to oral tomorrow. The only risk factor currently identified is use of oral contraceptives.  However it also appears that there may be a family history of thromboembolism.  She mentioned that her mother underwent genetic testing which was inconclusive.  Lower extremity Doppler studies negative for DVT.  Echocardiogram shows normal LVEF and normal right ventricular function as well. Seen by pulmonology.  No further testing or treatment is planned. Oral contraceptives to be discontinued.  Will refer patient to hematology at discharge. Patient still remains a bit tachycardic.  Continue with IV heparin  for another 24 hours and then reevaluate tomorrow regarding transition to oral anticoagulants. Start mobilizing gradually. No further episodes of syncope. Leukocytosis is better.  Mild drop in hemoglobin  is likely dilutional.  No evidence of overt bleeding.   Obesity Estimated body mass index is 41.25 kg/m as calculated from the following:   Height as of this encounter: 5' 4 (1.626 m).   Weight as of this encounter: 109 kg.   DVT Prophylaxis: On IV heparin  Code Status: Full code Family Communication: Discussed with patient.  Mother was updated yesterday.  Will do so again today. Disposition Plan: Hopefully return home when improved   Medications: Scheduled:  Chlorhexidine  Gluconate Cloth  6 each Topical Daily   sertraline   25 mg Oral Daily   Continuous:  doxycycline  (VIBRAMYCIN ) IV 100 mg (09/24/24 0759)   heparin  1,500 Units/hr (09/24/24 0739)   PRN:acetaminophen  **OR** acetaminophen , albuterol , HYDROcodone -acetaminophen , melatonin, morphine  injection, ondansetron  **OR** ondansetron  (ZOFRAN ) IV, mouth rinse  Antibiotics: Anti-infectives (From admission, onward)    Start     Dose/Rate Route Frequency Ordered Stop   09/23/24 0800  doxycycline  (VIBRAMYCIN ) 100 mg in sodium chloride  0.9 % 250 mL IVPB        100 mg 125 mL/hr over 120 Minutes Intravenous Every 12 hours 09/23/24 0705 09/28/24 0759       Objective:  Vital Signs  Vitals:   09/24/24 0500 09/24/24 0600 09/24/24 0700 09/24/24 0831  BP: 135/79 (!) 145/66 134/68   Pulse: (!) 108 (!) 111 98   Resp: (!) 29 (!) 25 18   Temp:    99.2 F (37.3 C)  TempSrc:    Oral  SpO2: 98% 99% 99%   Weight:      Height:        Intake/Output Summary (Last 24 hours) at 09/24/2024 0834 Last data filed at 09/24/2024 0126 Gross  per 24 hour  Intake 1931.25 ml  Output 1050 ml  Net 881.25 ml   Filed Weights   09/23/24 0010 09/23/24 0029  Weight: 109 kg 109 kg    General appearance: Awake alert.  In no distress Resp: Clear to auscultation bilaterally.  Normal effort Cardio: S1-S2 is rightly tachycardic.  Regular.  No S3-S4. GI: Abdomen is soft.  Nontender nondistended.  Bowel sounds are present normal.  No masses  organomegaly Extremities: No edema.  Full range of motion of lower extremities. Neurologic: Alert and oriented x3.  No focal neurological deficits.    Lab Results:  Data Reviewed: I have personally reviewed following labs and reports of the imaging studies  CBC: Recent Labs  Lab 09/22/24 1913 09/23/24 0414 09/24/24 0307  WBC 12.4* 14.5* 12.3*  NEUTROABS 8.9*  --   --   HGB 13.7 12.3 11.2*  HCT 42.7 38.9 36.5  MCV 74.5* 75.7* 77.7*  PLT 293 305 293    Basic Metabolic Panel: Recent Labs  Lab 09/22/24 1913 09/23/24 0414 09/24/24 0307  NA 138 138 138  K 4.6 3.9 4.1  CL 104 104 107  CO2 21* 21* 21*  GLUCOSE 92 99 91  BUN 9 6 <5*  CREATININE 0.70 0.72 0.74  CALCIUM 9.4 9.0 8.6*  MG  --   --  2.0    GFR: Estimated Creatinine Clearance: 135.3 mL/min (by C-G formula based on SCr of 0.74 mg/dL).  Liver Function Tests: Recent Labs  Lab 09/23/24 0414  AST 11*  ALT 8  ALKPHOS 70  BILITOT 0.4  PROT 7.1  ALBUMIN 3.7    BNP (last 3 results) Recent Labs    09/22/24 2214  PROBNP <50.0   CBG: Recent Labs  Lab 09/23/24 0537  GLUCAP 106*    Recent Results (from the past 240 hours)  Resp panel by RT-PCR (RSV, Flu A&B, Covid) Anterior Nasal Swab     Status: None   Collection Time: 09/22/24  9:03 PM   Specimen: Anterior Nasal Swab  Result Value Ref Range Status   SARS Coronavirus 2 by RT PCR NEGATIVE NEGATIVE Final    Comment: (NOTE) SARS-CoV-2 target nucleic acids are NOT DETECTED.  The SARS-CoV-2 RNA is generally detectable in upper respiratory specimens during the acute phase of infection. The lowest concentration of SARS-CoV-2 viral copies this assay can detect is 138 copies/mL. A negative result does not preclude SARS-Cov-2 infection and should not be used as the sole basis for treatment or other patient management decisions. A negative result may occur with  improper specimen collection/handling, submission of specimen other than nasopharyngeal swab,  presence of viral mutation(s) within the areas targeted by this assay, and inadequate number of viral copies(<138 copies/mL). A negative result must be combined with clinical observations, patient history, and epidemiological information. The expected result is Negative.  Fact Sheet for Patients:  BloggerCourse.com  Fact Sheet for Healthcare Providers:  SeriousBroker.it  This test is no t yet approved or cleared by the [place]  FDA and  has been authorized for detection and/or diagnosis of SARS-CoV-2 by FDA under an Emergency Use Authorization (EUA). This EUA will remain  in effect (meaning this test can be used) for the duration of the COVID-19 declaration under Section 564(b)(1) of the Act, 21 U.S.C.section 360bbb-3(b)(1), unless the authorization is terminated  or revoked sooner.       Influenza A by PCR NEGATIVE NEGATIVE Final   Influenza B by PCR NEGATIVE NEGATIVE Final    Comment: (NOTE)  The Xpert Xpress SARS-CoV-2/FLU/RSV plus assay is intended as an aid in the diagnosis of influenza from Nasopharyngeal swab specimens and should not be used as a sole basis for treatment. Nasal washings and aspirates are unacceptable for Xpert Xpress SARS-CoV-2/FLU/RSV testing.  Fact Sheet for Patients: BloggerCourse.com  Fact Sheet for Healthcare Providers: SeriousBroker.it  This test is not yet approved or cleared by the [place]  FDA and has been authorized for detection and/or diagnosis of SARS-CoV-2 by FDA under an Emergency Use Authorization (EUA). This EUA will remain in effect (meaning this test can be used) for the duration of the COVID-19 declaration under Section 564(b)(1) of the Act, 21 U.S.C. section 360bbb-3(b)(1), unless the authorization is terminated or revoked.     Resp Syncytial Virus by PCR NEGATIVE NEGATIVE Final    Comment: (NOTE) Fact Sheet for  Patients: BloggerCourse.com  Fact Sheet for Healthcare Providers: SeriousBroker.it  This test is not yet approved or cleared by the [place]  FDA and has been authorized for detection and/or diagnosis of SARS-CoV-2 by FDA under an Emergency Use Authorization (EUA). This EUA will remain in effect (meaning this test can be used) for the duration of the COVID-19 declaration under Section 564(b)(1) of the Act, 21 U.S.C. section 360bbb-3(b)(1), unless the authorization is terminated or revoked.  Performed at [facility], [address] [place], [place] [address]   MRSA Next Gen by PCR, Nasal     Status: None   Collection Time: 09/23/24 12:14 AM   Specimen: Nasal Mucosa; Nasal Swab  Result Value Ref Range Status   MRSA by PCR Next Gen NOT DETECTED NOT DETECTED Final    Comment: (NOTE) The GeneXpert MRSA Assay (FDA approved for NASAL specimens only), is one component of a comprehensive MRSA colonization surveillance program. It is not intended to diagnose MRSA infection nor to guide or monitor treatment for MRSA infections. Test performance is not FDA approved in patients less than 64 years old. Performed at [facility], [address], [place], [place] [address]   Respiratory (~20 pathogens) panel by PCR     Status: None   Collection Time: 09/23/24  7:06 AM   Specimen: Nasopharyngeal Swab; Respiratory  Result Value Ref Range Status   Adenovirus NOT DETECTED NOT DETECTED Final   Coronavirus 229E NOT DETECTED NOT DETECTED Final    Comment: (NOTE) The Coronavirus on the Respiratory Panel, DOES NOT test for the novel  Coronavirus (2019 nCoV)    Coronavirus HKU1 NOT DETECTED NOT DETECTED Final   Coronavirus NL63 NOT DETECTED NOT DETECTED Final   Coronavirus OC43 NOT DETECTED NOT DETECTED Final   Metapneumovirus NOT DETECTED NOT DETECTED Final   Rhinovirus / Enterovirus NOT DETECTED NOT DETECTED  Final   Influenza A NOT DETECTED NOT DETECTED Final   Influenza B NOT DETECTED NOT DETECTED Final   Parainfluenza Virus 1 NOT DETECTED NOT DETECTED Final   Parainfluenza Virus 2 NOT DETECTED NOT DETECTED Final   Parainfluenza Virus 3 NOT DETECTED NOT DETECTED Final   Parainfluenza Virus 4 NOT DETECTED NOT DETECTED Final   Respiratory Syncytial Virus NOT DETECTED NOT DETECTED Final   Bordetella pertussis NOT DETECTED NOT DETECTED Final   Bordetella Parapertussis NOT DETECTED NOT DETECTED Final   Chlamydophila pneumoniae NOT DETECTED NOT DETECTED Final   Mycoplasma pneumoniae NOT DETECTED NOT DETECTED Final    Comment: Performed at [facility], [address] [place], [place] [address]      Radiology Studies: ECHOCARDIOGRAM COMPLETE Result  Date: 09/23/2024    ECHOCARDIOGRAM REPORT   Patient Name:   Pamela Berg Date of Exam: 09/23/2024 Medical Rec #:  [id number]    Height:       64.0 in Accession #:    [id number]   Weight:       240.3 lb Date of Birth:  04/21/2001   BSA:          2.115 m Patient Age:    22 years     BP:           122/66 mmHg Patient Gender: F            HR:           104 bpm. Exam Location:  Inpatient Procedure: 2D Echo, Cardiac Doppler and Color Doppler (Both Spectral and Color            Flow Doppler were utilized during procedure). Indications:    I26.02 Pulmonary embolus  History:        Patient has no prior history of Echocardiogram examinations.                 Signs/Symptoms:Dyspnea.  Sonographer:    Ellouise Mose RDCS Referring Phys: [phone] MATTHEW R HUNSUCKER  Sonographer Comments: Technically difficult study due to poor echo windows, patient is obese and no subcostal window. Image acquisition challenging due to patient body habitus. Very difficult apicals due to habitus and restrictive clothing. IMPRESSIONS  1. Left ventricular ejection fraction, by estimation, is 60 to 65%. Left ventricular ejection fraction by 2D MOD biplane is 67.0 %. Left ventricular ejection  fraction by PLAX is 67 %. The left ventricle has normal function. The left ventricle has no regional wall motion abnormalities. Left ventricular diastolic parameters were normal.  2. Right ventricular systolic function is normal. The right ventricular size is normal. There is normal pulmonary artery systolic pressure. The estimated right ventricular systolic pressure is 32.4 mmHg.  3. The mitral valve is normal in structure. No evidence of mitral valve regurgitation. No evidence of mitral stenosis.  4. The aortic valve is tricuspid. Aortic valve regurgitation is not visualized. No aortic stenosis is present.  5. The inferior vena cava is normal in size with <50% respiratory variability, suggesting right atrial pressure of 8 mmHg. Comparison(s): No prior Echocardiogram. FINDINGS  Left Ventricle: Left ventricular ejection fraction, by estimation, is 60 to 65%. Left ventricular ejection fraction by PLAX is 67 %. Left ventricular ejection fraction by 2D MOD biplane is 67.0 %. The left ventricle has normal function. The left ventricle has no regional wall motion abnormalities. The left ventricular internal cavity size was normal in size. There is borderline left ventricular hypertrophy. Left ventricular diastolic parameters were normal. Right Ventricle: The right ventricular size is normal. No increase in right ventricular wall thickness. Right ventricular systolic function is normal. There is normal pulmonary artery systolic pressure. The tricuspid regurgitant velocity is 2.47 m/s, and  with an assumed right atrial pressure of 8 mmHg, the estimated right ventricular systolic pressure is 32.4 mmHg. Left Atrium: Left atrial size was normal in size. Right Atrium: Right atrial size was normal in size. Pericardium: There is no evidence of pericardial effusion. Mitral Valve: The mitral valve is normal in structure. No evidence of mitral valve regurgitation. No evidence of mitral valve stenosis. Tricuspid Valve: The tricuspid  valve is normal in structure. Tricuspid valve regurgitation is trivial. No evidence of tricuspid stenosis. Aortic Valve: The aortic valve is tricuspid. Aortic valve regurgitation is not visualized.  No aortic stenosis is present. Pulmonic Valve: The pulmonic valve was normal in structure. Pulmonic valve regurgitation is not visualized. No evidence of pulmonic stenosis. Aorta: The aortic root and ascending aorta are structurally normal, with no evidence of dilitation. Venous: The inferior vena cava is normal in size with less than 50% respiratory variability, suggesting right atrial pressure of 8 mmHg. IAS/Shunts: No atrial level shunt detected by color flow Doppler.  LEFT VENTRICLE PLAX 2D                        Biplane EF (MOD) LV EF:         Left            LV Biplane EF:   Left                ventricular                      ventricular                ejection                         ejection                fraction by                      fraction by                PLAX is 67                       2D MOD                %.                               biplane is LVIDd:         4.30 cm                          67.0 %. LVIDs:         2.70 cm LV PW:         1.00 cm         Diastology LV IVS:        1.10 cm         LV e' medial:    9.79 cm/s LVOT diam:     2.20 cm         LV E/e' medial:  7.8 LV SV:         71              LV e' lateral:   14.90 cm/s LV SV Index:   33              LV E/e' lateral: 5.1 LVOT Area:     3.80 cm  LV Volumes (MOD) LV vol d, MOD    77.9 ml A2C: LV vol d, MOD    80.0 ml A4C: LV vol s, MOD    23.2 ml A2C: LV vol s, MOD    28.0 ml A4C: LV SV MOD A2C:   54.7 ml LV SV MOD A4C:   80.0 ml LV SV MOD BP:    53.4 ml RIGHT VENTRICLE  IVC RV S prime:     14.40 cm/s  IVC diam: 2.00 cm TAPSE (M-mode): 2.2 cm LEFT ATRIUM             Index        RIGHT ATRIUM           Index LA diam:        2.80 cm 1.32 cm/m   RA Area:     12.40 cm LA Vol (A2C):   24.7 ml 11.68 ml/m  RA Volume:   27.60 ml   13.05 ml/m LA Vol (A4C):   17.8 ml 8.42 ml/m LA Biplane Vol: 22.1 ml 10.45 ml/m  AORTIC VALVE LVOT Vmax:   116.00 cm/s LVOT Vmean:  75.800 cm/s LVOT VTI:    0.186 m  AORTA Ao Root diam: 2.90 cm Ao Asc diam:  2.80 cm MITRAL VALVE               TRICUSPID VALVE MV Area (PHT): 4.80 cm    TR Peak grad:   24.4 mmHg MV Decel Time: 158 msec    TR Vmax:        247.00 cm/s MV E velocity: 76.45 cm/s MV A velocity: 75.80 cm/s  SHUNTS MV E/A ratio:  1.01        Systemic VTI:  0.19 m                            Systemic Diam: 2.20 cm Dalton McleanMD Electronically signed by Ezra Kanner Signature Date/Time: 09/23/2024/7:25:05 PM    Final    VAS US  LOWER EXTREMITY VENOUS (DVT) Result Date: 09/23/2024  Lower Venous DVT Study Patient Name:  Pamela Berg  Date of Exam:   09/23/2024 Medical Rec #: [id number]     Accession #:    [id number] Date of Birth: May 03, 2001    Patient Gender: F Patient Age:   22 years Exam Location:  [facility] Procedure:      VAS US  LOWER EXTREMITY VENOUS (DVT) Referring Phys: CAMILA NED --------------------------------------------------------------------------------  Indications: Pulmonary embolism.  Risk Factors: Oral contraceptive use. Comparison Study: No previous exams Performing Technologist: Jody Hill RVT, RDMS  Examination Guidelines: A complete evaluation includes B-mode imaging, spectral Doppler, color Doppler, and power Doppler as needed of all accessible portions of each vessel. Bilateral testing is considered an integral part of a complete examination. Limited examinations for reoccurring indications may be performed as noted. The reflux portion of the exam is performed with the patient in reverse Trendelenburg.  +---------+---------------+---------+-----------+----------+--------------+ RIGHT    CompressibilityPhasicitySpontaneityPropertiesThrombus Aging +---------+---------------+---------+-----------+----------+--------------+ CFV      Full           Yes       Yes                                 +---------+---------------+---------+-----------+----------+--------------+ SFJ      Full                                                        +---------+---------------+---------+-----------+----------+--------------+ FV Prox  Full           Yes      Yes                                 +---------+---------------+---------+-----------+----------+--------------+  FV Mid   Full           Yes      Yes                                 +---------+---------------+---------+-----------+----------+--------------+ FV DistalFull           Yes      Yes                                 +---------+---------------+---------+-----------+----------+--------------+ PFV      Full                                                        +---------+---------------+---------+-----------+----------+--------------+ POP      Full           Yes      Yes                                 +---------+---------------+---------+-----------+----------+--------------+ PTV      Full                                                        +---------+---------------+---------+-----------+----------+--------------+ PERO     Full                                                        +---------+---------------+---------+-----------+----------+--------------+   +---------+---------------+---------+-----------+----------+--------------+ LEFT     CompressibilityPhasicitySpontaneityPropertiesThrombus Aging +---------+---------------+---------+-----------+----------+--------------+ CFV      Full           Yes      Yes                                 +---------+---------------+---------+-----------+----------+--------------+ SFJ      Full                                                        +---------+---------------+---------+-----------+----------+--------------+ FV Prox  Full           Yes      Yes                                  +---------+---------------+---------+-----------+----------+--------------+ FV Mid   Full           Yes      Yes                                 +---------+---------------+---------+-----------+----------+--------------+ FV DistalFull  Yes      Yes                                 +---------+---------------+---------+-----------+----------+--------------+ PFV      Full                                                        +---------+---------------+---------+-----------+----------+--------------+ POP      Full           Yes      Yes                                 +---------+---------------+---------+-----------+----------+--------------+ PTV      Full                                                        +---------+---------------+---------+-----------+----------+--------------+ PERO     Full                                                        +---------+---------------+---------+-----------+----------+--------------+     Summary: BILATERAL: - No evidence of deep vein thrombosis seen in the lower extremities, bilaterally. -No evidence of popliteal cyst, bilaterally.   *See table(s) above for measurements and observations. Electronically signed by Lonni Gaskins MD on 09/23/2024 at 2:25:50 PM.    Final    CT Angio Chest PE W/Cm &/Or Wo Cm Result Date: 09/22/2024 EXAM: CTA CHEST 09/22/2024 08:54:38 PM TECHNIQUE: CTA of the chest was performed after the administration of intravenous contrast (iohexol  (OMNIPAQUE ) 350 MG/ML injection 80 mL IOHEXOL  350 MG/ML SOLN). Multiplanar reformatted images are provided for review. MIP images are provided for review. Automated exposure control, iterative reconstruction, and/or weight based adjustment of the mA/kV was utilized to reduce the radiation dose to as low as reasonably achievable. COMPARISON: None available. CLINICAL HISTORY: Pulmonary embolism (PE) suspected, low to intermediate prob, positive D-dimer. PE  suspected, elevated Ddimer, lower leg and feet swelling, tachycardic. FINDINGS: PULMONARY ARTERIES: There are branching intraluminal filling defects identified within the lobar and segmental pulmonary arteries of the lower lobes bilaterally in keeping with acute pulmonary emboli. The embolic burden is moderate. MEDIASTINUM: No CT evidence of right heart strain. No significant coronary artery calcification. Global cardiac size within normal limits. No pericardial effusion. Thoracic Aorta is unremarkable. LYMPH NODES: No pathologic thoracic adenopathy. LUNGS AND PLEURA: Patchy ground glass opacity within the basilar lingula, which may represent atelectasis or infiltrate in the setting of a developing pulmonary infarct. Bibasilar atelectasis noted. No pneumothorax or pleural effusion. No central obstructing lesion. UPPER ABDOMEN: Limited images of the upper abdomen are unremarkable. SOFT TISSUES AND BONES: No acute bone abnormality. No lytic or blastic bone lesion. IMPRESSION: 1. Acute pulmonary emboli involving the lobar and segmental pulmonary arteries of the lower lobes bilaterally, with moderate embolic burden. No CT evidence of right heart strain. 2. Patchy ground glass  opacity within the basilar lingula, possibly representing atelectasis or infiltrate in the setting of a developing pulmonary infarct.\ 3. Findings were discussed personally with Dr. Lenor at time of dictation Electronically signed by: Dorethia Molt MD 09/22/2024 09:16 PM EDT RP Workstation: [id number]   DG Chest 2 View Result Date: 09/22/2024 CLINICAL DATA:  SHOB EXAM: CHEST - 2 VIEW COMPARISON:  None available. FINDINGS: Lower lung volumes. Bilateral perihilar interstitial opacities. No focal airspace consolidation, pleural effusion, or pneumothorax. No cardiomegaly.No acute fracture or destructive lesion. IMPRESSION: Bilateral perihilar interstitial opacities, which may represent bronchovascular crowding due to low lung volumes, atypical/viral  infection, or changes of asthma. Electronically Signed   By: Rogelia Myers M.D.   On: 09/22/2024 17:29       LOS: 1 day   Joette Pebbles  [facility] Pager on [email or web address]  09/24/2024, 8:34 AM

## 2024-09-24 NOTE — Progress Notes (Signed)
 PHARMACY - ANTICOAGULATION CONSULT NOTE  Pharmacy Consult for heparin  Indication: pulmonary embolus  Allergies  Allergen Reactions   Shellfish-Derived Products Anaphylaxis    Patient Measurements: Height: 5' 4 (162.6 cm) Weight: 109 kg (240 lb 4.8 oz) IBW/kg (Calculated) : 54.7 HEPARIN  DW (KG): 80.6  Vital Signs: Temp: 99.2 F (37.3 C) (09/26 0831) Temp Source: Oral (09/26 0831) BP: 121/80 (09/26 1100) Pulse Rate: 105 (09/26 1100)  Labs: Recent Labs    09/22/24 1913 09/22/24 1913 09/23/24 0414 09/23/24 1114 09/24/24 0307 09/24/24 1155  HGB 13.7  --  12.3  --  11.2*  --   HCT 42.7  --  38.9  --  36.5  --   PLT 293  --  305  --  293  --   HEPARINUNFRC  --    < > 0.41 0.37 0.21* 0.45  CREATININE 0.70  --  0.72  --  0.74  --    < > = values in this interval not displayed.    Estimated Creatinine Clearance: 135.3 mL/min (by C-G formula based on SCr of 0.74 mg/dL).   Medical History: Past Medical History:  Diagnosis Date   Premature baby      Assessment: 39 YOF presenting with SOB/CP, CT angio chest shows acute PE, no RHS.  She is not on anticoagulation PTA  09/24/2024: Heparin  level @ 1155 am = 0.45 -  therapeutic after 1200 unit IV bolus and heparin  rate increase to 1500 units/hr   CBC: Hg 11.2, Plt WNL No bleeding or infusion related concerns reported by RN  Goal of Therapy:  Heparin  level 0.3-0.7 units/ml Monitor platelets by anticoagulation protocol: Yes   Plan:  Continue heparin  infusion @ 1500 units/hr Daily heparin  level & CBC while on heparin  Anticipate transition to DOAC tomorrow   Rosaline IVAR Edison, Pharm.D Use secure chat for questions 09/24/2024 1:15 PM

## 2024-09-24 NOTE — Progress Notes (Signed)
 PHARMACY - ANTICOAGULATION CONSULT NOTE  Pharmacy Consult for heparin  Indication: pulmonary embolus  Allergies  Allergen Reactions   Shellfish-Derived Products Anaphylaxis    Patient Measurements: Height: 5' 4 (162.6 cm) Weight: 109 kg (240 lb 4.8 oz) IBW/kg (Calculated) : 54.7 HEPARIN  DW (KG): 80.6  Vital Signs: Temp: 100 F (37.8 C) (09/26 0330) Temp Source: Oral (09/26 0330) BP: 153/93 (09/26 0400) Pulse Rate: 115 (09/26 0400)  Labs: Recent Labs    09/22/24 1913 09/23/24 0414 09/23/24 1114 09/24/24 0307  HGB 13.7 12.3  --  11.2*  HCT 42.7 38.9  --  36.5  PLT 293 305  --  293  HEPARINUNFRC  --  0.41 0.37 0.21*  CREATININE 0.70 0.72  --  0.74    Estimated Creatinine Clearance: 135.3 mL/min (by C-G formula based on SCr of 0.74 mg/dL).   Medical History: Past Medical History:  Diagnosis Date   Premature baby      Assessment: 37 YOF presenting with SOB/CP, CT angio chest shows acute PE, no RHS.  She is not on anticoagulation PTA  09/24/2024: Heparin  level 0.21-  subtherapeutic this AM on IV heparin  1350 units/hr   CBC: Hg 11.2, Plt WNL No bleeding or infusion related concerns reported by RN  Goal of Therapy:  Heparin  level 0.3-0.7 units/ml Monitor platelets by anticoagulation protocol: Yes   Plan:  Give heparin  1200 unit IV bolus x 1 Increase heparin  infusion to 1500 units/hr Check heparin  level 6 hr after rate increase Daily heparin  level & CBC while on heparin  F/u long term AC plan  Arvin Gauss, PharmD 09/24/2024 5:21 AM

## 2024-09-25 DIAGNOSIS — I2699 Other pulmonary embolism without acute cor pulmonale: Secondary | ICD-10-CM | POA: Diagnosis not present

## 2024-09-25 LAB — BASIC METABOLIC PANEL WITH GFR
Anion gap: 12 (ref 5–15)
BUN: 6 mg/dL (ref 6–20)
CO2: 21 mmol/L — ABNORMAL LOW (ref 22–32)
Calcium: 9.2 mg/dL (ref 8.9–10.3)
Chloride: 104 mmol/L (ref 98–111)
Creatinine, Ser: 0.67 mg/dL (ref 0.44–1.00)
GFR, Estimated: >60 mL/min (ref >60)
Glucose, Bld: 76 mg/dL (ref 70–99)
Potassium: 3.8 mmol/L (ref 3.5–5.1)
Sodium: 136 mmol/L (ref 135–145)

## 2024-09-25 LAB — CBC
HCT: 40 % (ref 36.0–46.0)
Hemoglobin: 12.1 g/dL (ref 12.0–15.0)
MCH: 23.3 pg — ABNORMAL LOW (ref 26.0–34.0)
MCHC: 30.3 g/dL (ref 30.0–36.0)
MCV: 77.1 fL — ABNORMAL LOW (ref 80.0–100.0)
Platelets: 353 K/uL (ref 150–400)
RBC: 5.19 MIL/uL — ABNORMAL HIGH (ref 3.87–5.11)
RDW: 15.5 % (ref 11.5–15.5)
WBC: 13.7 K/uL — ABNORMAL HIGH (ref 4.0–10.5)
nRBC: 0 % (ref 0.0–0.2)

## 2024-09-25 LAB — HEPARIN LEVEL (UNFRACTIONATED): Heparin Unfractionated: 0.36 [IU]/mL (ref 0.30–0.70)

## 2024-09-25 MED ORDER — SODIUM CHLORIDE 0.45 % IV SOLN: INTRAVENOUS | Status: DC

## 2024-09-25 MED ORDER — RIVAROXABAN 15 MG PO TABS
15.0000 mg | ORAL_TABLET | Freq: Two times a day (BID) | ORAL | Status: DC
Start: 2024-09-25 — End: 2024-10-16
  Administered 2024-09-25 – 2024-09-27 (×5): 15 mg via ORAL
  Filled 2024-09-25 (×5): qty 1

## 2024-09-25 MED ORDER — DOXYCYCLINE HYCLATE 100 MG PO TABS
100.0000 mg | ORAL_TABLET | Freq: Two times a day (BID) | ORAL | Status: DC
  Administered 2024-09-25 – 2024-09-27 (×5): 100 mg via ORAL
  Filled 2024-09-25 (×5): qty 1

## 2024-09-25 MED ORDER — SENNOSIDES-DOCUSATE SODIUM 8.6-50 MG PO TABS
2.0000 | ORAL_TABLET | Freq: Two times a day (BID) | ORAL | Status: DC
  Administered 2024-09-25 – 2024-09-26 (×3): 2 via ORAL
  Filled 2024-09-25 (×5): qty 2

## 2024-09-25 MED ORDER — POLYETHYLENE GLYCOL 3350 17 G PO PACK
17.0000 g | PACK | Freq: Every day | ORAL | Status: DC
  Administered 2024-09-25: 17 g via ORAL
  Filled 2024-09-25 (×3): qty 1

## 2024-09-25 MED ORDER — RIVAROXABAN 20 MG PO TABS: 20.0000 mg | ORAL_TABLET | Freq: Every day | ORAL | Status: DC

## 2024-09-25 NOTE — Progress Notes (Signed)
SATURATION QUALIFICATIONS: (This note is used to comply with regulatory documentation for home oxygen)  Patient Saturations on Room Air at Rest = 95%  Patient Saturations on Room Air while Ambulating = 88%  Patient Saturations on 2 Liters of oxygen while Ambulating = 93%  Please briefly explain why patient needs home oxygen:

## 2024-09-25 NOTE — Progress Notes (Signed)
 [facility] PROGRESS NOTE   Pamela Berg FMW:[id number] DOB: 05-25-2001 DOA: 09/22/2024  PCP: Kline, Chianne, PA-C  Brief History: 23 y.o. female with  no significant past medical history who presents to [facility] with complaint of shortness of breath x 1 day.  No recent travel.  No sick contacts.  No hospitalizations.  She does take oral contraceptives on a daily basis.  Patient was evaluated in the ED and underwent CT angiogram of her chest which showed acute pulmonary embolism.  She was hospitalized for further management.   Consultants: Pulmonology  Procedures: Echocardiogram.  Lower extremity Doppler.    Subjective/Interval History: Patient's pain at the worst is 6 out of 10 in intensity.  Feels better this morning.  Has not really ambulated much.  Still noted to be on oxygen.  Denies any difficulty breathing.    Assessment/Plan:  Acute pulmonary embolism/syncope Moderate clot burden noted on CT angiogram.  Probable area of evolving pulmonary infarct was also noted.  Patient was noted to have leukocytosis.  Empirically on doxycycline .  Will give a 5-day course.  Changed to oral today. The only risk factor for her venous thromboembolism that is currently identified is use of oral contraceptives.  However it also appears that there may be a family history of thromboembolism.  She mentioned that her mother underwent genetic testing which was inconclusive.  Lower extremity Doppler studies negative for DVT.  Echocardiogram shows normal LVEF and normal right ventricular function as well. Seen by pulmonology.  No further testing or treatment is planned. Oral contraceptives to be discontinued.  Will refer patient to hematology at discharge. Patient remains tachycardic.  Will transition IV heparin  to Xarelto today. No further episodes of syncope.  Leukocytosis is stable.  Hemoglobin is stable.  No evidence of overt bleeding. Will give her IV fluids for 24 hours to see if it helps her  tachycardia.  Will check TSH.    Obesity Estimated body mass index is 41.25 kg/m as calculated from the following:   Height as of this encounter: 5' 4 (1.626 m).   Weight as of this encounter: 109 kg.   DVT Prophylaxis: On IV heparin  Code Status: Full code Family Communication: Discussed with patient.  Will update her mother. Disposition Plan: Hopefully return home when improved   Medications: Scheduled:  Chlorhexidine  Gluconate Cloth  6 each Topical Daily   sertraline   25 mg Oral Daily   Continuous:  sodium chloride  100 mL/hr at 09/25/24 0800   doxycycline  (VIBRAMYCIN ) IV 100 mg (09/24/24 2113)   heparin  1,500 Units/hr (09/25/24 0026)   PRN:acetaminophen  **OR** acetaminophen , albuterol , HYDROcodone -acetaminophen , melatonin, morphine  injection, ondansetron  **OR** ondansetron  (ZOFRAN ) IV, mouth rinse  Antibiotics: Anti-infectives (From admission, onward)    Start     Dose/Rate Route Frequency Ordered Stop   09/23/24 0800  doxycycline  (VIBRAMYCIN ) 100 mg in sodium chloride  0.9 % 250 mL IVPB        100 mg 125 mL/hr over 120 Minutes Intravenous Every 12 hours 09/23/24 0705 09/28/24 0759       Objective:  Vital Signs  Vitals:   09/24/24 1648 09/24/24 2155 09/25/24 0319 09/25/24 0647  BP: (!) 146/99 (!) 155/110 136/81 (!) 135/101  Pulse: (!) 116 (!) 116 (!) 119 (!) 115  Resp: 20 18 20 20   Temp: 99.5 F (37.5 C) 99.3 F (37.4 C) 98.9 F (37.2 C) 98.6 F (37 C)  TempSrc: Oral Oral Oral Oral  SpO2: 100% 97% 99% 97%  Weight:      Height:  Intake/Output Summary (Last 24 hours) at 09/25/2024 0945 Last data filed at 09/25/2024 0336 Gross per 24 hour  Intake 1272 ml  Output --  Net 1272 ml   Filed Weights   09/23/24 0010 09/23/24 0029  Weight: 109 kg 109 kg   General appearance: Awake alert.  In no distress Resp: Clear to auscultation bilaterally.  Normal effort Cardio: S1-S2 is tachycardic regular.  No S3-S4.  No rubs murmurs or bruit GI: Abdomen is  soft.  Nontender nondistended.  Bowel sounds are present normal.  No masses organomegaly Extremities: No edema.  Full range of motion of lower extremities. Neurologic: Alert and oriented x3.  No focal neurological deficits.    Lab Results:  Data Reviewed: I have personally reviewed following labs and reports of the imaging studies  CBC: Recent Labs  Lab 09/22/24 1913 09/23/24 0414 09/24/24 0307 09/25/24 0508  WBC 12.4* 14.5* 12.3* 13.7*  NEUTROABS 8.9*  --   --   --   HGB 13.7 12.3 11.2* 12.1  HCT 42.7 38.9 36.5 40.0  MCV 74.5* 75.7* 77.7* 77.1*  PLT 293 305 293 353    Basic Metabolic Panel: Recent Labs  Lab 09/22/24 1913 09/23/24 0414 09/24/24 0307 09/25/24 0508  NA 138 138 138 136  K 4.6 3.9 4.1 3.8  CL 104 104 107 104  CO2 21* 21* 21* 21*  GLUCOSE 92 99 91 76  BUN 9 6 <5* 6  CREATININE 0.70 0.72 0.74 0.67  CALCIUM 9.4 9.0 8.6* 9.2  MG  --   --  2.0  --     GFR: Estimated Creatinine Clearance: 135.3 mL/min (by C-G formula based on SCr of 0.67 mg/dL).  Liver Function Tests: Recent Labs  Lab 09/23/24 0414  AST 11*  ALT 8  ALKPHOS 70  BILITOT 0.4  PROT 7.1  ALBUMIN 3.7    BNP (last 3 results) Recent Labs    09/22/24 2214  PROBNP <50.0   CBG: Recent Labs  Lab 09/23/24 0537  GLUCAP 106*    Recent Results (from the past 240 hours)  Resp panel by RT-PCR (RSV, Flu A&B, Covid) Anterior Nasal Swab     Status: None   Collection Time: 09/22/24  9:03 PM   Specimen: Anterior Nasal Swab  Result Value Ref Range Status   SARS Coronavirus 2 by RT PCR NEGATIVE NEGATIVE Final    Comment: (NOTE) SARS-CoV-2 target nucleic acids are NOT DETECTED.  The SARS-CoV-2 RNA is generally detectable in upper respiratory specimens during the acute phase of infection. The lowest concentration of SARS-CoV-2 viral copies this assay can detect is 138 copies/mL. A negative result does not preclude SARS-Cov-2 infection and should not be used as the sole basis for  treatment or other patient management decisions. A negative result may occur with  improper specimen collection/handling, submission of specimen other than nasopharyngeal swab, presence of viral mutation(s) within the areas targeted by this assay, and inadequate number of viral copies(<138 copies/mL). A negative result must be combined with clinical observations, patient history, and epidemiological information. The expected result is Negative.  Fact Sheet for Patients:  BloggerCourse.com  Fact Sheet for Healthcare Providers:  SeriousBroker.it  This test is no t yet approved or cleared by the [place]  FDA and  has been authorized for detection and/or diagnosis of SARS-CoV-2 by FDA under an Emergency Use Authorization (EUA). This EUA will remain  in effect (meaning this test can be used) for the duration of the COVID-19 declaration under Section 564(b)(1) of the  Act, 21 U.S.C.section 360bbb-3(b)(1), unless the authorization is terminated  or revoked sooner.       Influenza A by PCR NEGATIVE NEGATIVE Final   Influenza B by PCR NEGATIVE NEGATIVE Final    Comment: (NOTE) The Xpert Xpress SARS-CoV-2/FLU/RSV plus assay is intended as an aid in the diagnosis of influenza from Nasopharyngeal swab specimens and should not be used as a sole basis for treatment. Nasal washings and aspirates are unacceptable for Xpert Xpress SARS-CoV-2/FLU/RSV testing.  Fact Sheet for Patients: BloggerCourse.com  Fact Sheet for Healthcare Providers: SeriousBroker.it  This test is not yet approved or cleared by the [place]  FDA and has been authorized for detection and/or diagnosis of SARS-CoV-2 by FDA under an Emergency Use Authorization (EUA). This EUA will remain in effect (meaning this test can be used) for the duration of the COVID-19 declaration under Section 564(b)(1) of the Act, 21  U.S.C. section 360bbb-3(b)(1), unless the authorization is terminated or revoked.     Resp Syncytial Virus by PCR NEGATIVE NEGATIVE Final    Comment: (NOTE) Fact Sheet for Patients: BloggerCourse.com  Fact Sheet for Healthcare Providers: SeriousBroker.it  This test is not yet approved or cleared by the [place]  FDA and has been authorized for detection and/or diagnosis of SARS-CoV-2 by FDA under an Emergency Use Authorization (EUA). This EUA will remain in effect (meaning this test can be used) for the duration of the COVID-19 declaration under Section 564(b)(1) of the Act, 21 U.S.C. section 360bbb-3(b)(1), unless the authorization is terminated or revoked.  Performed at [facility], [address] [place], [place] [address]   MRSA Next Gen by PCR, Nasal     Status: None   Collection Time: 09/23/24 12:14 AM   Specimen: Nasal Mucosa; Nasal Swab  Result Value Ref Range Status   MRSA by PCR Next Gen NOT DETECTED NOT DETECTED Final    Comment: (NOTE) The GeneXpert MRSA Assay (FDA approved for NASAL specimens only), is one component of a comprehensive MRSA colonization surveillance program. It is not intended to diagnose MRSA infection nor to guide or monitor treatment for MRSA infections. Test performance is not FDA approved in patients less than 41 years old. Performed at [facility], [address], [place], [place] [address]   Respiratory (~20 pathogens) panel by PCR     Status: None   Collection Time: 09/23/24  7:06 AM   Specimen: Nasopharyngeal Swab; Respiratory  Result Value Ref Range Status   Adenovirus NOT DETECTED NOT DETECTED Final   Coronavirus 229E NOT DETECTED NOT DETECTED Final    Comment: (NOTE) The Coronavirus on the Respiratory Panel, DOES NOT test for the novel  Coronavirus (2019 nCoV)    Coronavirus HKU1 NOT DETECTED NOT DETECTED Final   Coronavirus NL63 NOT DETECTED  NOT DETECTED Final   Coronavirus OC43 NOT DETECTED NOT DETECTED Final   Metapneumovirus NOT DETECTED NOT DETECTED Final   Rhinovirus / Enterovirus NOT DETECTED NOT DETECTED Final   Influenza A NOT DETECTED NOT DETECTED Final   Influenza B NOT DETECTED NOT DETECTED Final   Parainfluenza Virus 1 NOT DETECTED NOT DETECTED Final   Parainfluenza Virus 2 NOT DETECTED NOT DETECTED Final   Parainfluenza Virus 3 NOT DETECTED NOT DETECTED Final   Parainfluenza Virus 4 NOT DETECTED NOT DETECTED Final   Respiratory Syncytial Virus NOT DETECTED NOT DETECTED Final   Bordetella pertussis NOT DETECTED NOT DETECTED Final   Bordetella Parapertussis NOT DETECTED NOT DETECTED Final   Chlamydophila pneumoniae NOT  DETECTED NOT DETECTED Final   Mycoplasma pneumoniae NOT DETECTED NOT DETECTED Final    Comment: Performed at [facility], [address], [place], [place] [address]      Radiology Studies: ECHOCARDIOGRAM COMPLETE Result Date: 09/23/2024    ECHOCARDIOGRAM REPORT   Patient Name:   Pamela Berg Date of Exam: 09/23/2024 Medical Rec #:  [id number]    Height:       64.0 in Accession #:    [id number]   Weight:       240.3 lb Date of Birth:  [birth year]   BSA:          2.115 m Patient Age:    22 years     BP:           122/66 mmHg Patient Gender: F            HR:           104 bpm. Exam Location:  Inpatient Procedure: 2D Echo, Cardiac Doppler and Color Doppler (Both Spectral and Color            Flow Doppler were utilized during procedure). Indications:    I26.02 Pulmonary embolus  History:        Patient has no prior history of Echocardiogram examinations.                 Signs/Symptoms:Dyspnea.  Sonographer:    Ellouise Mose RDCS Referring Phys: [phone] MATTHEW R HUNSUCKER  Sonographer Comments: Technically difficult study due to poor echo windows, patient is obese and no subcostal window. Image acquisition challenging due to patient body habitus. Very difficult apicals due to habitus and restrictive clothing.  IMPRESSIONS  1. Left ventricular ejection fraction, by estimation, is 60 to 65%. Left ventricular ejection fraction by 2D MOD biplane is 67.0 %. Left ventricular ejection fraction by PLAX is 67 %. The left ventricle has normal function. The left ventricle has no regional wall motion abnormalities. Left ventricular diastolic parameters were normal.  2. Right ventricular systolic function is normal. The right ventricular size is normal. There is normal pulmonary artery systolic pressure. The estimated right ventricular systolic pressure is 32.4 mmHg.  3. The mitral valve is normal in structure. No evidence of mitral valve regurgitation. No evidence of mitral stenosis.  4. The aortic valve is tricuspid. Aortic valve regurgitation is not visualized. No aortic stenosis is present.  5. The inferior vena cava is normal in size with <50% respiratory variability, suggesting right atrial pressure of 8 mmHg. Comparison(s): No prior Echocardiogram. FINDINGS  Left Ventricle: Left ventricular ejection fraction, by estimation, is 60 to 65%. Left ventricular ejection fraction by PLAX is 67 %. Left ventricular ejection fraction by 2D MOD biplane is 67.0 %. The left ventricle has normal function. The left ventricle has no regional wall motion abnormalities. The left ventricular internal cavity size was normal in size. There is borderline left ventricular hypertrophy. Left ventricular diastolic parameters were normal. Right Ventricle: The right ventricular size is normal. No increase in right ventricular wall thickness. Right ventricular systolic function is normal. There is normal pulmonary artery systolic pressure. The tricuspid regurgitant velocity is 2.47 m/s, and  with an assumed right atrial pressure of 8 mmHg, the estimated right ventricular systolic pressure is 32.4 mmHg. Left Atrium: Left atrial size was normal in size. Right Atrium: Right atrial size was normal in size. Pericardium: There is no evidence of pericardial  effusion. Mitral Valve: The mitral valve is normal in structure. No evidence of mitral  valve regurgitation. No evidence of mitral valve stenosis. Tricuspid Valve: The tricuspid valve is normal in structure. Tricuspid valve regurgitation is trivial. No evidence of tricuspid stenosis. Aortic Valve: The aortic valve is tricuspid. Aortic valve regurgitation is not visualized. No aortic stenosis is present. Pulmonic Valve: The pulmonic valve was normal in structure. Pulmonic valve regurgitation is not visualized. No evidence of pulmonic stenosis. Aorta: The aortic root and ascending aorta are structurally normal, with no evidence of dilitation. Venous: The inferior vena cava is normal in size with less than 50% respiratory variability, suggesting right atrial pressure of 8 mmHg. IAS/Shunts: No atrial level shunt detected by color flow Doppler.  LEFT VENTRICLE PLAX 2D                        Biplane EF (MOD) LV EF:         Left            LV Biplane EF:   Left                ventricular                      ventricular                ejection                         ejection                fraction by                      fraction by                PLAX is 67                       2D MOD                %.                               biplane is LVIDd:         4.30 cm                          67.0 %. LVIDs:         2.70 cm LV PW:         1.00 cm         Diastology LV IVS:        1.10 cm         LV e' medial:    9.79 cm/s LVOT diam:     2.20 cm         LV E/e' medial:  7.8 LV SV:         71              LV e' lateral:   14.90 cm/s LV SV Index:   33              LV E/e' lateral: 5.1 LVOT Area:     3.80 cm  LV Volumes (MOD) LV vol d, MOD    77.9 ml A2C: LV vol d, MOD    80.0 ml A4C: LV vol s, MOD    23.2 ml A2C: LV vol s, MOD  28.0 ml A4C: LV SV MOD A2C:   54.7 ml LV SV MOD A4C:   80.0 ml LV SV MOD BP:    53.4 ml RIGHT VENTRICLE             IVC RV S prime:     14.40 cm/s  IVC diam: 2.00 cm TAPSE (M-mode): 2.2 cm LEFT  ATRIUM             Index        RIGHT ATRIUM           Index LA diam:        2.80 cm 1.32 cm/m   RA Area:     12.40 cm LA Vol (A2C):   24.7 ml 11.68 ml/m  RA Volume:   27.60 ml  13.05 ml/m LA Vol (A4C):   17.8 ml 8.42 ml/m LA Biplane Vol: 22.1 ml 10.45 ml/m  AORTIC VALVE LVOT Vmax:   116.00 cm/s LVOT Vmean:  75.800 cm/s LVOT VTI:    0.186 m  AORTA Ao Root diam: 2.90 cm Ao Asc diam:  2.80 cm MITRAL VALVE               TRICUSPID VALVE MV Area (PHT): 4.80 cm    TR Peak grad:   24.4 mmHg MV Decel Time: 158 msec    TR Vmax:        247.00 cm/s MV E velocity: 76.45 cm/s MV A velocity: 75.80 cm/s  SHUNTS MV E/A ratio:  1.01        Systemic VTI:  0.19 m                            Systemic Diam: 2.20 cm Dalton McleanMD Electronically signed by Ezra Kanner Signature Date/Time: 09/23/2024/7:25:05 PM    Final    VAS US  LOWER EXTREMITY VENOUS (DVT) Result Date: 09/23/2024  Lower Venous DVT Study Patient Name:  Pamela Berg  Date of Exam:   09/23/2024 Medical Rec #: [id number]     Accession #:    [id number] Date of Birth: 2001/11/20    Patient Gender: F Patient Age:   22 years Exam Location:  [facility] Procedure:      VAS US  LOWER EXTREMITY VENOUS (DVT) Referring Phys: CAMILA NED --------------------------------------------------------------------------------  Indications: Pulmonary embolism.  Risk Factors: Oral contraceptive use. Comparison Study: No previous exams Performing Technologist: Jody Hill RVT, RDMS  Examination Guidelines: A complete evaluation includes B-mode imaging, spectral Doppler, color Doppler, and power Doppler as needed of all accessible portions of each vessel. Bilateral testing is considered an integral part of a complete examination. Limited examinations for reoccurring indications may be performed as noted. The reflux portion of the exam is performed with the patient in reverse Trendelenburg.  +---------+---------------+---------+-----------+----------+--------------+ RIGHT     CompressibilityPhasicitySpontaneityPropertiesThrombus Aging +---------+---------------+---------+-----------+----------+--------------+ CFV      Full           Yes      Yes                                 +---------+---------------+---------+-----------+----------+--------------+ SFJ      Full                                                        +---------+---------------+---------+-----------+----------+--------------+  FV Prox  Full           Yes      Yes                                 +---------+---------------+---------+-----------+----------+--------------+ FV Mid   Full           Yes      Yes                                 +---------+---------------+---------+-----------+----------+--------------+ FV DistalFull           Yes      Yes                                 +---------+---------------+---------+-----------+----------+--------------+ PFV      Full                                                        +---------+---------------+---------+-----------+----------+--------------+ POP      Full           Yes      Yes                                 +---------+---------------+---------+-----------+----------+--------------+ PTV      Full                                                        +---------+---------------+---------+-----------+----------+--------------+ PERO     Full                                                        +---------+---------------+---------+-----------+----------+--------------+   +---------+---------------+---------+-----------+----------+--------------+ LEFT     CompressibilityPhasicitySpontaneityPropertiesThrombus Aging +---------+---------------+---------+-----------+----------+--------------+ CFV      Full           Yes      Yes                                 +---------+---------------+---------+-----------+----------+--------------+ SFJ      Full                                                         +---------+---------------+---------+-----------+----------+--------------+ FV Prox  Full           Yes      Yes                                 +---------+---------------+---------+-----------+----------+--------------+ FV Mid   Full  Yes      Yes                                 +---------+---------------+---------+-----------+----------+--------------+ FV DistalFull           Yes      Yes                                 +---------+---------------+---------+-----------+----------+--------------+ PFV      Full                                                        +---------+---------------+---------+-----------+----------+--------------+ POP      Full           Yes      Yes                                 +---------+---------------+---------+-----------+----------+--------------+ PTV      Full                                                        +---------+---------------+---------+-----------+----------+--------------+ PERO     Full                                                        +---------+---------------+---------+-----------+----------+--------------+     Summary: BILATERAL: - No evidence of deep vein thrombosis seen in the lower extremities, bilaterally. -No evidence of popliteal cyst, bilaterally.   *See table(s) above for measurements and observations. Electronically signed by Lonni Gaskins MD on 09/23/2024 at 2:25:50 PM.    Final        LOS: 2 days   Joette Pebbles  [facility] Pager on [email or web address]  09/25/2024, 9:45 AM

## 2024-09-25 NOTE — Discharge Instructions (Signed)
 Information on my medicine - XARELTO (rivaroxaban) This medication education was reviewed with me or my healthcare representative as part of my discharge preparation.    WHY WAS XARELTO PRESCRIBED FOR YOU? Xarelto was prescribed to treat blood clots that may have been found in the veins of your legs (deep vein thrombosis) or in your lungs (pulmonary embolism) and to reduce the risk of them occurring again.  What do you need to know about Xarelto? The starting dose is one 15 mg tablet taken TWICE daily with food for the FIRST 21 DAYS then on 10/16/2024  the dose is changed to one 20 mg tablet taken ONCE A DAY with your evening meal.  DO NOT stop taking Xarelto without talking to the health care provider who prescribed the medication.  Refill your prescription for 20 mg tablets before you run out.  After discharge, you should have regular check-up appointments with your healthcare provider that is prescribing your Xarelto.  In the future your dose may need to be changed if your kidney function changes by a significant amount.  What do you do if you miss a dose? If you are taking Xarelto TWICE DAILY and you miss a dose, take it as soon as you remember. You may take two 15 mg tablets (total 30 mg) at the same time then resume your regularly scheduled 15 mg twice daily the next day.  If you are taking Xarelto ONCE DAILY and you miss a dose, take it as soon as you remember on the same day then continue your regularly scheduled once daily regimen the next day. Do not take two doses of Xarelto at the same time.   Important Safety Information Xarelto is a blood thinner medicine that can cause bleeding. You should call your healthcare provider right away if you experience any of the following: Bleeding from an injury or your nose that does not stop. Unusual colored urine (red or dark brown) or unusual colored stools (red or black). Unusual bruising for unknown reasons. A serious fall or if you  hit your head (even if there is no bleeding).  Some medicines may interact with Xarelto and might increase your risk of bleeding while on Xarelto. To help avoid this, consult your healthcare provider or pharmacist prior to using any new prescription or non-prescription medications, including herbals, vitamins, non-steroidal anti-inflammatory drugs (NSAIDs) and supplements.  This website has more information on Xarelto: VisitDestination.com.br.

## 2024-09-25 NOTE — Progress Notes (Signed)
 PHARMACY - ANTICOAGULATION CONSULT NOTE  Pharmacy Consult for heparin  Indication: pulmonary embolus  Allergies  Allergen Reactions   Shellfish-Derived Products Anaphylaxis    Patient Measurements: Height: 5' 4 (162.6 cm) Weight: 109 kg (240 lb 4.8 oz) IBW/kg (Calculated) : 54.7 HEPARIN  DW (KG): 80.6  Vital Signs: Temp: 98.6 F (37 C) (09/27 0647) Temp Source: Oral (09/27 0647) BP: 135/101 (09/27 0647) Pulse Rate: 115 (09/27 0647)  Labs: Recent Labs    09/23/24 0414 09/23/24 1114 09/24/24 0307 09/24/24 1155 09/25/24 0508  HGB 12.3  --  11.2*  --  12.1  HCT 38.9  --  36.5  --  40.0  PLT 305  --  293  --  353  HEPARINUNFRC 0.41   < > 0.21* 0.45 0.36  CREATININE 0.72  --  0.74  --  0.67   < > = values in this interval not displayed.    Estimated Creatinine Clearance: 135.3 mL/min (by C-G formula based on SCr of 0.67 mg/dL).   Medical History: Past Medical History:  Diagnosis Date   Premature baby      Assessment: 62 YOF presenting on 9/24 with SOB/CP.  CT angio chest shows acute PE, no RHS.  She is not on anticoagulation PTA.  Pharmacy is consulted to dose Heparin  IV.   09/25/2024: Heparin  level 0.36, therapeutic on heparin  1500 units/hr   CBC: Hgb and Plt WNL No bleeding or infusion related concerns reported  Goal of Therapy:  Heparin  level 0.3-0.7 units/ml Monitor platelets by anticoagulation protocol: Yes   Plan:  Continue heparin  infusion 1500 units/hr Daily heparin  level & CBC while on heparin  Anticipate transition to DOAC, will follow up plans.     Wanda Hasting PharmD, BCPS [facility] pharmacy [phone] 09/25/2024 8:34 AM

## 2024-09-25 NOTE — Progress Notes (Signed)
 PHARMACY - ANTICOAGULATION CONSULT NOTE  Pharmacy Consult for Xarelto Indication: pulmonary embolus  Allergies  Allergen Reactions   Shellfish-Derived Products Anaphylaxis    Patient Measurements: Height: 5' 4 (162.6 cm) Weight: 109 kg (240 lb 4.8 oz) IBW/kg (Calculated) : 54.7 HEPARIN  DW (KG): 80.6  Vital Signs: Temp: 98.6 F (37 C) (09/27 0647) Temp Source: Oral (09/27 0647) BP: 135/101 (09/27 0647) Pulse Rate: 115 (09/27 0647)  Labs: Recent Labs    09/23/24 0414 09/23/24 1114 09/24/24 0307 09/24/24 1155 09/25/24 0508  HGB 12.3  --  11.2*  --  12.1  HCT 38.9  --  36.5  --  40.0  PLT 305  --  293  --  353  HEPARINUNFRC 0.41   < > 0.21* 0.45 0.36  CREATININE 0.72  --  0.74  --  0.67   < > = values in this interval not displayed.    Estimated Creatinine Clearance: 135.3 mL/min (by C-G formula based on SCr of 0.67 mg/dL).   Medical History: Past Medical History:  Diagnosis Date   Premature baby      Assessment: 24 YOF presenting on 9/24 with SOB/CP.  CT angio chest shows acute PE, no RHS.  She is not on anticoagulation PTA.  Pharmacy is consulted to transition from Heparin  IV to Xarelto  09/25/2024: Heparin  level 0.36, therapeutic on heparin  1500 units/hr   CBC: Hgb and Plt WNL No bleeding or infusion related concerns reported  Goal of Therapy:  Heparin  level 0.3-0.7 units/ml Monitor platelets by anticoagulation protocol: Yes   Plan:  D/c Heparin , at the same time start Xarelto. Xarelto 15 mg PO BID x21 days, then 20mg  once daily. Pharmacy to provide counseling prior to discharge.      Wanda Hasting PharmD, BCPS [facility] main pharmacy [phone] 09/25/2024 10:31 AM

## 2024-09-26 DIAGNOSIS — I2699 Other pulmonary embolism without acute cor pulmonale: Secondary | ICD-10-CM | POA: Diagnosis not present

## 2024-09-26 LAB — CBC
HCT: 36.7 % (ref 36.0–46.0)
Hemoglobin: 11.2 g/dL — ABNORMAL LOW (ref 12.0–15.0)
MCH: 23.5 pg — ABNORMAL LOW (ref 26.0–34.0)
MCHC: 30.5 g/dL (ref 30.0–36.0)
MCV: 76.9 fL — ABNORMAL LOW (ref 80.0–100.0)
Platelets: 351 K/uL (ref 150–400)
RBC: 4.77 MIL/uL (ref 3.87–5.11)
RDW: 15.4 % (ref 11.5–15.5)
WBC: 9.5 K/uL (ref 4.0–10.5)
nRBC: 0 % (ref 0.0–0.2)

## 2024-09-26 LAB — BASIC METABOLIC PANEL WITH GFR
Anion gap: 11 (ref 5–15)
BUN: 6 mg/dL (ref 6–20)
CO2: 22 mmol/L (ref 22–32)
Calcium: 9 mg/dL (ref 8.9–10.3)
Chloride: 103 mmol/L (ref 98–111)
Creatinine, Ser: 0.65 mg/dL (ref 0.44–1.00)
GFR, Estimated: >60 mL/min (ref >60)
Glucose, Bld: 80 mg/dL (ref 70–99)
Potassium: 3.5 mmol/L (ref 3.5–5.1)
Sodium: 135 mmol/L (ref 135–145)

## 2024-09-26 LAB — TSH: TSH: 1.22 u[IU]/mL (ref 0.350–4.500)

## 2024-09-26 MED ORDER — METOPROLOL TARTRATE 25 MG PO TABS
12.5000 mg | ORAL_TABLET | Freq: Two times a day (BID) | ORAL | Status: DC
  Administered 2024-09-26 – 2024-09-27 (×2): 12.5 mg via ORAL
  Filled 2024-09-26 (×2): qty 1

## 2024-09-26 MED ORDER — POTASSIUM CHLORIDE CRYS ER 20 MEQ PO TBCR
40.0000 meq | EXTENDED_RELEASE_TABLET | Freq: Once | ORAL | Status: AC
  Administered 2024-09-26: 40 meq via ORAL
  Filled 2024-09-26: qty 2

## 2024-09-26 NOTE — Progress Notes (Signed)
 [facility] PROGRESS NOTE   Pamela Berg FMW:[id number] DOB: 03/27/2001 DOA: 09/22/2024  PCP: Kline, Chianne, PA-C  Brief History: 23 y.o. female with  no significant past medical history who presents to [facility] with complaint of shortness of breath x 1 day.  No recent travel.  No sick contacts.  No hospitalizations.  She does take oral contraceptives on a daily basis.  Patient was evaluated in the ED and underwent CT angiogram of her chest which showed acute pulmonary embolism.  She was hospitalized for further management.   Consultants: Pulmonology  Procedures: Echocardiogram.  Lower extremity Doppler.    Subjective/Interval History: Patient is slowly improving.  Pain is better but still requiring pain medications.  Was able to ambulate some yesterday.  Had mild hypoxia.  Denies any shortness of breath currently.  Encouraged to do incentive spirometry.    Assessment/Plan:  Acute pulmonary embolism/syncope Moderate clot burden noted on CT angiogram.  Probable area of evolving pulmonary infarct was also noted.  Patient was noted to have leukocytosis.  Empirically on doxycycline .  Will give a 5-day course.  Changed to oral today. The only risk factor for her venous thromboembolism that is currently identified is use of oral contraceptives.  However it also appears that there may be a family history of thromboembolism.  She mentioned that her mother underwent genetic testing which was inconclusive.  Lower extremity Doppler studies negative for DVT.  Echocardiogram shows normal LVEF and normal right ventricular function as well. Seen by pulmonology.  No further testing or treatment is planned. Oral contraceptives to be discontinued.  Will refer patient to hematology at discharge. Patient was treated with IV heparin  and then transitioned to rivaroxaban. Ambulated some yesterday.  Tachycardia seems to be improving.  Was given IV fluids for 24 hours.  TSH is normal.  Supplement  potassium today.  Hemoglobin is stable.  Continue with incentive spirometry.  Anticipate discharge in 24 to 48 hours.  Obesity Estimated body mass index is 41.25 kg/m as calculated from the following:   Height as of this encounter: 5' 4 (1.626 m).   Weight as of this encounter: 109 kg.   DVT Prophylaxis: On rivaroxaban Code Status: Full code Family Communication: Discussed with patient and her mother Disposition Plan: Anticipate discharge home tomorrow   Medications: Scheduled:  Chlorhexidine  Gluconate Cloth  6 each Topical Daily   doxycycline   100 mg Oral Q12H   polyethylene glycol  17 g Oral Daily   Rivaroxaban  15 mg Oral BID WC   Followed by   NOREEN ON 10/16/2024] rivaroxaban  20 mg Oral Q supper   senna-docusate  2 tablet Oral BID   sertraline   25 mg Oral Daily   Continuous:   PRN:acetaminophen  **OR** acetaminophen , albuterol , HYDROcodone -acetaminophen , melatonin, morphine  injection, ondansetron  **OR** ondansetron  (ZOFRAN ) IV, mouth rinse  Antibiotics: Anti-infectives (From admission, onward)    Start     Dose/Rate Route Frequency Ordered Stop   09/25/24 1045  doxycycline  (VIBRA -TABS) tablet 100 mg        100 mg Oral Every 12 hours 09/25/24 0949 09/28/24 0959   09/23/24 0800  doxycycline  (VIBRAMYCIN ) 100 mg in sodium chloride  0.9 % 250 mL IVPB  Status:  Discontinued        100 mg 125 mL/hr over 120 Minutes Intravenous Every 12 hours 09/23/24 0705 09/25/24 0949       Objective:  Vital Signs  Vitals:   09/25/24 1000 09/25/24 1445 09/25/24 2050 09/26/24 0454  BP:  125/74 (!) 147/98 ROLLEN)  139/90  Pulse:   (!) 105 (!) 103  Resp:  16 15 20   Temp:   99.4 F (37.4 C) 98.2 F (36.8 C)  TempSrc:    Oral  SpO2: 95% 95% 99% 99%  Weight:      Height:        Intake/Output Summary (Last 24 hours) at 09/26/2024 0921 Last data filed at 09/26/2024 0443 Gross per 24 hour  Intake 2674.92 ml  Output --  Net 2674.92 ml   Filed Weights   09/23/24 0010 09/23/24 0029   Weight: 109 kg 109 kg   General appearance: Awake alert.  In no distress Resp: Clear to auscultation bilaterally.  Normal effort Cardio: S1-S2 is tachycardic regular.  No S3-S4. GI: Abdomen is soft.  Nontender nondistended.  Bowel sounds are present normal.  No masses organomegaly Extremities: No edema.  Full range of motion of lower extremities. Neurologic: Alert and oriented x3.  No focal neurological deficits.     Lab Results:  Data Reviewed: I have personally reviewed following labs and reports of the imaging studies  CBC: Recent Labs  Lab 09/22/24 1913 09/23/24 0414 09/24/24 0307 09/25/24 0508 09/26/24 0436  WBC 12.4* 14.5* 12.3* 13.7* 9.5  NEUTROABS 8.9*  --   --   --   --   HGB 13.7 12.3 11.2* 12.1 11.2*  HCT 42.7 38.9 36.5 40.0 36.7  MCV 74.5* 75.7* 77.7* 77.1* 76.9*  PLT 293 305 293 353 351    Basic Metabolic Panel: Recent Labs  Lab 09/22/24 1913 09/23/24 0414 09/24/24 0307 09/25/24 0508 09/26/24 0436  NA 138 138 138 136 135  K 4.6 3.9 4.1 3.8 3.5  CL 104 104 107 104 103  CO2 21* 21* 21* 21* 22  GLUCOSE 92 99 91 76 80  BUN 9 6 <5* 6 6  CREATININE 0.70 0.72 0.74 0.67 0.65  CALCIUM 9.4 9.0 8.6* 9.2 9.0  MG  --   --  2.0  --   --     GFR: Estimated Creatinine Clearance: 135.3 mL/min (by C-G formula based on SCr of 0.65 mg/dL).  Liver Function Tests: Recent Labs  Lab 09/23/24 0414  AST 11*  ALT 8  ALKPHOS 70  BILITOT 0.4  PROT 7.1  ALBUMIN 3.7    BNP (last 3 results) Recent Labs    09/22/24 2214  PROBNP <50.0   CBG: Recent Labs  Lab 09/23/24 0537  GLUCAP 106*    Recent Results (from the past 240 hours)  Resp panel by RT-PCR (RSV, Flu A&B, Covid) Anterior Nasal Swab     Status: None   Collection Time: 09/22/24  9:03 PM   Specimen: Anterior Nasal Swab  Result Value Ref Range Status   SARS Coronavirus 2 by RT PCR NEGATIVE NEGATIVE Final    Comment: (NOTE) SARS-CoV-2 target nucleic acids are NOT DETECTED.  The SARS-CoV-2 RNA is  generally detectable in upper respiratory specimens during the acute phase of infection. The lowest concentration of SARS-CoV-2 viral copies this assay can detect is 138 copies/mL. A negative result does not preclude SARS-Cov-2 infection and should not be used as the sole basis for treatment or other patient management decisions. A negative result may occur with  improper specimen collection/handling, submission of specimen other than nasopharyngeal swab, presence of viral mutation(s) within the areas targeted by this assay, and inadequate number of viral copies(<138 copies/mL). A negative result must be combined with clinical observations, patient history, and epidemiological information. The expected result is Negative.  Fact  Sheet for Patients:  BloggerCourse.com  Fact Sheet for Healthcare Providers:  SeriousBroker.it  This test is no t yet approved or cleared by the [place]  FDA and  has been authorized for detection and/or diagnosis of SARS-CoV-2 by FDA under an Emergency Use Authorization (EUA). This EUA will remain  in effect (meaning this test can be used) for the duration of the COVID-19 declaration under Section 564(b)(1) of the Act, 21 U.S.C.section 360bbb-3(b)(1), unless the authorization is terminated  or revoked sooner.       Influenza A by PCR NEGATIVE NEGATIVE Final   Influenza B by PCR NEGATIVE NEGATIVE Final    Comment: (NOTE) The Xpert Xpress SARS-CoV-2/FLU/RSV plus assay is intended as an aid in the diagnosis of influenza from Nasopharyngeal swab specimens and should not be used as a sole basis for treatment. Nasal washings and aspirates are unacceptable for Xpert Xpress SARS-CoV-2/FLU/RSV testing.  Fact Sheet for Patients: BloggerCourse.com  Fact Sheet for Healthcare Providers: SeriousBroker.it  This test is not yet approved or cleared by the [place] FDA and has been authorized for detection and/or diagnosis of SARS-CoV-2 by FDA under an Emergency Use Authorization (EUA). This EUA will remain in effect (meaning this test can be used) for the duration of the COVID-19 declaration under Section 564(b)(1) of the Act, 21 U.S.C. section 360bbb-3(b)(1), unless the authorization is terminated or revoked.     Resp Syncytial Virus by PCR NEGATIVE NEGATIVE Final    Comment: (NOTE) Fact Sheet for Patients: BloggerCourse.com  Fact Sheet for Healthcare Providers: SeriousBroker.it  This test is not yet approved or cleared by the [place]  FDA and has been authorized for detection and/or diagnosis of SARS-CoV-2 by FDA under an Emergency Use Authorization (EUA). This EUA will remain in effect (meaning this test can be used) for the duration of the COVID-19 declaration under Section 564(b)(1) of the Act, 21 U.S.C. section 360bbb-3(b)(1), unless the authorization is terminated or revoked.  Performed at [facility], [address] [place], [place] [address]   MRSA Next Gen by PCR, Nasal     Status: None   Collection Time: 09/23/24 12:14 AM   Specimen: Nasal Mucosa; Nasal Swab  Result Value Ref Range Status   MRSA by PCR Next Gen NOT DETECTED NOT DETECTED Final    Comment: (NOTE) The GeneXpert MRSA Assay (FDA approved for NASAL specimens only), is one component of a comprehensive MRSA colonization surveillance program. It is not intended to diagnose MRSA infection nor to guide or monitor treatment for MRSA infections. Test performance is not FDA approved in patients less than 21 years old. Performed at [facility], [address] [place], [place] [address]   Respiratory (~20 pathogens) panel by PCR     Status: None   Collection Time: 09/23/24  7:06 AM   Specimen: Nasopharyngeal Swab; Respiratory  Result Value Ref Range Status   Adenovirus NOT  DETECTED NOT DETECTED Final   Coronavirus 229E NOT DETECTED NOT DETECTED Final    Comment: (NOTE) The Coronavirus on the Respiratory Panel, DOES NOT test for the novel  Coronavirus (2019 nCoV)    Coronavirus HKU1 NOT DETECTED NOT DETECTED Final   Coronavirus NL63 NOT DETECTED NOT DETECTED Final   Coronavirus OC43 NOT DETECTED NOT DETECTED Final   Metapneumovirus NOT DETECTED NOT DETECTED Final   Rhinovirus / Enterovirus NOT DETECTED NOT DETECTED Final   Influenza A NOT DETECTED NOT DETECTED Final   Influenza B NOT DETECTED NOT DETECTED Final  Parainfluenza Virus 1 NOT DETECTED NOT DETECTED Final   Parainfluenza Virus 2 NOT DETECTED NOT DETECTED Final   Parainfluenza Virus 3 NOT DETECTED NOT DETECTED Final   Parainfluenza Virus 4 NOT DETECTED NOT DETECTED Final   Respiratory Syncytial Virus NOT DETECTED NOT DETECTED Final   Bordetella pertussis NOT DETECTED NOT DETECTED Final   Bordetella Parapertussis NOT DETECTED NOT DETECTED Final   Chlamydophila pneumoniae NOT DETECTED NOT DETECTED Final   Mycoplasma pneumoniae NOT DETECTED NOT DETECTED Final    Comment: Performed at [facility], [address] [place], [place] [address]      Radiology Studies: No results found.      LOS: 3 days   Patrici Minnis [employer] on [email or web address]  09/26/2024, 9:21 AM

## 2024-09-27 ENCOUNTER — Other Ambulatory Visit (HOSPITAL_COMMUNITY): Payer: Self-pay

## 2024-09-27 DIAGNOSIS — I2699 Other pulmonary embolism without acute cor pulmonale: Secondary | ICD-10-CM | POA: Diagnosis not present

## 2024-09-27 LAB — CBC
HCT: 38.4 % (ref 36.0–46.0)
Hemoglobin: 12.2 g/dL (ref 12.0–15.0)
MCH: 23.9 pg — ABNORMAL LOW (ref 26.0–34.0)
MCHC: 31.8 g/dL (ref 30.0–36.0)
MCV: 75.3 fL — ABNORMAL LOW (ref 80.0–100.0)
Platelets: 414 K/uL — ABNORMAL HIGH (ref 150–400)
RBC: 5.1 MIL/uL (ref 3.87–5.11)
RDW: 15.2 % (ref 11.5–15.5)
WBC: 8.6 K/uL (ref 4.0–10.5)
nRBC: 0.2 % (ref 0.0–0.2)

## 2024-09-27 LAB — BASIC METABOLIC PANEL WITH GFR
Anion gap: 13 (ref 5–15)
BUN: 7 mg/dL (ref 6–20)
CO2: 22 mmol/L (ref 22–32)
Calcium: 9.5 mg/dL (ref 8.9–10.3)
Chloride: 103 mmol/L (ref 98–111)
Creatinine, Ser: 0.63 mg/dL (ref 0.44–1.00)
GFR, Estimated: >60 mL/min (ref >60)
Glucose, Bld: 87 mg/dL (ref 70–99)
Potassium: 3.7 mmol/L (ref 3.5–5.1)
Sodium: 137 mmol/L (ref 135–145)

## 2024-09-27 MED ORDER — RIVAROXABAN 20 MG PO TABS
20.0000 mg | ORAL_TABLET | Freq: Every day | ORAL | 3 refills | Status: DC
  Filled 2024-09-27: qty 30, 30d supply, fill #0

## 2024-09-27 MED ORDER — HYDROCODONE-ACETAMINOPHEN 5-325 MG PO TABS
1.0000 | ORAL_TABLET | Freq: Four times a day (QID) | ORAL | 0 refills | Status: AC | PRN
  Filled 2024-09-27: qty 30, 4d supply, fill #0

## 2024-09-27 MED ORDER — RIVAROXABAN (XARELTO) VTE STARTER PACK (15 & 20 MG)
ORAL_TABLET | ORAL | 0 refills | Status: AC
  Filled 2024-09-27: qty 51, 30d supply, fill #0

## 2024-09-27 MED ORDER — RIVAROXABAN 20 MG PO TABS
20.0000 mg | ORAL_TABLET | Freq: Every day | ORAL | 3 refills | Status: AC
  Filled 2024-09-27: qty 30, 30d supply, fill #0

## 2024-09-27 MED ORDER — ACETAMINOPHEN 325 MG PO TABS: 650.0000 mg | ORAL_TABLET | Freq: Four times a day (QID) | ORAL | Status: AC | PRN

## 2024-09-27 MED ORDER — METOPROLOL SUCCINATE ER 25 MG PO TB24
25.0000 mg | ORAL_TABLET | Freq: Every day | ORAL | 11 refills | Status: AC
  Filled 2024-09-27: qty 30, 30d supply, fill #0

## 2024-09-27 NOTE — Progress Notes (Signed)
 SATURATION QUALIFICATIONS: (This note is used to comply with regulatory documentation for home oxygen)  Patient Saturations on Room Air at Rest = 98%  Patient Saturations on Room Air while Ambulating = 89%  Patient Saturations on 0 Liters of oxygen while Ambulating = 89%  Please briefly explain why patient needs home oxygen:

## 2024-09-27 NOTE — Progress Notes (Signed)
Post ambulation VS

## 2024-09-27 NOTE — Progress Notes (Signed)
 Patient walked at 70 feet at room air, HR at 130's, O2 sats at 93%. Denies chest pains or shortness of breath. Patient placed on 2lpm O2 and allowed to walk another 40 feet. Patient HR remains tachy at 120s and O2 sats at 95%. Respirations even and unlabored during the entire activity. Well tolerated. Patient verbalizing she feels ok. See VS.

## 2024-09-27 NOTE — Discharge Summary (Signed)
 [facility]  Physician Discharge Summary   Patient ID: Pamela Berg MRN: [id number] DOB/AGE: [birth year] 23 y.o.  Admit date: 09/22/2024 Discharge date: 09/27/2024    PCP: Kline, Chianne, PA-C  DISCHARGE DIAGNOSES:  Acute pulmonary embolism  RECOMMENDATIONS FOR OUTPATIENT FOLLOW UP: Ambulatory referral sent to hematology Patient to follow-up with PCP   Home Health: None Equipment/Devices: None  CODE STATUS: Full code  DISCHARGE CONDITION: fair  Diet recommendation: As before  INITIAL HISTORY: 23 y.o. female with  no significant past medical history who presents to [facility] with complaint of shortness of breath x 1 day.  No recent travel.  No sick contacts.  No hospitalizations.  She does take oral contraceptives on a daily basis.  Patient was evaluated in the ED and underwent CT angiogram of her chest which showed acute pulmonary embolism.  She was hospitalized for further management.    Consultants: Pulmonology   Procedures: Echocardiogram.  Lower extremity Doppler.  HOSPITAL COURSE:   Acute pulmonary embolism/syncope Moderate clot burden noted on CT angiogram.  Probable area of evolving pulmonary infarct was also noted.  Patient was noted to have leukocytosis.  Empirically treated with doxycycline  for 5 days. The only risk factor for her venous thromboembolism that is currently identified is use of oral contraceptives.  However it also appears that there may be a family history of thromboembolism.  She mentioned that her mother underwent genetic testing which was inconclusive.  Lower extremity Doppler studies negative for DVT.  Echocardiogram shows normal LVEF and normal right ventricular function as well. Seen by pulmonology.  No further testing or treatment is planned. Oral contraceptives to be discontinued.  Will refer patient to hematology at discharge. Patient was treated with IV heparin  and then transitioned to rivaroxaban. Tachycardia has improved.   Oxygenation has improved.  Does not need home oxygen.  Elevated blood pressure Patient mentioned that she has had elevated blood pressure for a few months.  This was detected initially by her primary care provider.  Blood pressure noted to be 150s over 100s.  She was started on metoprolol.  Further management in the outpatient setting.   Obesity Estimated body mass index is 41.25 kg/m as calculated from the following:   Height as of this encounter: 5' 4 (1.626 m).   Weight as of this encounter: 109 kg.   Patient is stable.  Okay for discharge home today.  Discussed with her mother.   PERTINENT LABS:  The results of significant diagnostics from this hospitalization (including imaging, microbiology, ancillary and laboratory) are listed below for reference.    Microbiology: Recent Results (from the past 240 hours)  Resp panel by RT-PCR (RSV, Flu A&B, Covid) Anterior Nasal Swab     Status: None   Collection Time: 09/22/24  9:03 PM   Specimen: Anterior Nasal Swab  Result Value Ref Range Status   SARS Coronavirus 2 by RT PCR NEGATIVE NEGATIVE Final    Comment: (NOTE) SARS-CoV-2 target nucleic acids are NOT DETECTED.  The SARS-CoV-2 RNA is generally detectable in upper respiratory specimens during the acute phase of infection. The lowest concentration of SARS-CoV-2 viral copies this assay can detect is 138 copies/mL. A negative result does not preclude SARS-Cov-2 infection and should not be used as the sole basis for treatment or other patient management decisions. A negative result may occur with  improper specimen collection/handling, submission of specimen other than nasopharyngeal swab, presence of viral mutation(s) within the areas targeted by this assay, and inadequate number of viral copies(<138 copies/mL).  A negative result must be combined with clinical observations, patient history, and epidemiological information. The expected result is Negative.  Fact Sheet for  Patients:  BloggerCourse.com  Fact Sheet for Healthcare Providers:  SeriousBroker.it  This test is no t yet approved or cleared by the [place]  FDA and  has been authorized for detection and/or diagnosis of SARS-CoV-2 by FDA under an Emergency Use Authorization (EUA). This EUA will remain  in effect (meaning this test can be used) for the duration of the COVID-19 declaration under Section 564(b)(1) of the Act, 21 U.S.C.section 360bbb-3(b)(1), unless the authorization is terminated  or revoked sooner.       Influenza A by PCR NEGATIVE NEGATIVE Final   Influenza B by PCR NEGATIVE NEGATIVE Final    Comment: (NOTE) The Xpert Xpress SARS-CoV-2/FLU/RSV plus assay is intended as an aid in the diagnosis of influenza from Nasopharyngeal swab specimens and should not be used as a sole basis for treatment. Nasal washings and aspirates are unacceptable for Xpert Xpress SARS-CoV-2/FLU/RSV testing.  Fact Sheet for Patients: BloggerCourse.com  Fact Sheet for Healthcare Providers: SeriousBroker.it  This test is not yet approved or cleared by the [place]  FDA and has been authorized for detection and/or diagnosis of SARS-CoV-2 by FDA under an Emergency Use Authorization (EUA). This EUA will remain in effect (meaning this test can be used) for the duration of the COVID-19 declaration under Section 564(b)(1) of the Act, 21 U.S.C. section 360bbb-3(b)(1), unless the authorization is terminated or revoked.     Resp Syncytial Virus by PCR NEGATIVE NEGATIVE Final    Comment: (NOTE) Fact Sheet for Patients: BloggerCourse.com  Fact Sheet for Healthcare Providers: SeriousBroker.it  This test is not yet approved or cleared by the [place]  FDA and has been authorized for detection and/or diagnosis of SARS-CoV-2 by FDA under an Emergency  Use Authorization (EUA). This EUA will remain in effect (meaning this test can be used) for the duration of the COVID-19 declaration under Section 564(b)(1) of the Act, 21 U.S.C. section 360bbb-3(b)(1), unless the authorization is terminated or revoked.  Performed at [facility], [address] [place], [place] [address]   MRSA Next Gen by PCR, Nasal     Status: None   Collection Time: 09/23/24 12:14 AM   Specimen: Nasal Mucosa; Nasal Swab  Result Value Ref Range Status   MRSA by PCR Next Gen NOT DETECTED NOT DETECTED Final    Comment: (NOTE) The GeneXpert MRSA Assay (FDA approved for NASAL specimens only), is one component of a comprehensive MRSA colonization surveillance program. It is not intended to diagnose MRSA infection nor to guide or monitor treatment for MRSA infections. Test performance is not FDA approved in patients less than 47 years old. Performed at [facility], [address] [place], [place] [address]   Respiratory (~20 pathogens) panel by PCR     Status: None   Collection Time: 09/23/24  7:06 AM   Specimen: Nasopharyngeal Swab; Respiratory  Result Value Ref Range Status   Adenovirus NOT DETECTED NOT DETECTED Final   Coronavirus 229E NOT DETECTED NOT DETECTED Final    Comment: (NOTE) The Coronavirus on the Respiratory Panel, DOES NOT test for the novel  Coronavirus (2019 nCoV)    Coronavirus HKU1 NOT DETECTED NOT DETECTED Final   Coronavirus NL63 NOT DETECTED NOT DETECTED Final   Coronavirus OC43 NOT DETECTED NOT DETECTED Final   Metapneumovirus NOT DETECTED NOT DETECTED Final   Rhinovirus / Enterovirus NOT DETECTED NOT  DETECTED Final   Influenza A NOT DETECTED NOT DETECTED Final   Influenza B NOT DETECTED NOT DETECTED Final   Parainfluenza Virus 1 NOT DETECTED NOT DETECTED Final   Parainfluenza Virus 2 NOT DETECTED NOT DETECTED Final   Parainfluenza Virus 3 NOT DETECTED NOT DETECTED Final   Parainfluenza Virus 4 NOT  DETECTED NOT DETECTED Final   Respiratory Syncytial Virus NOT DETECTED NOT DETECTED Final   Bordetella pertussis NOT DETECTED NOT DETECTED Final   Bordetella Parapertussis NOT DETECTED NOT DETECTED Final   Chlamydophila pneumoniae NOT DETECTED NOT DETECTED Final   Mycoplasma pneumoniae NOT DETECTED NOT DETECTED Final    Comment: Performed at [facility], [address], [place], [place] [address]     Labs:   Basic Metabolic Panel: Recent Labs  Lab 09/23/24 0414 09/24/24 0307 09/25/24 0508 09/26/24 0436 09/27/24 0546  NA 138 138 136 135 137  K 3.9 4.1 3.8 3.5 3.7  CL 104 107 104 103 103  CO2 21* 21* 21* 22 22  GLUCOSE 99 91 76 80 87  BUN 6 <5* 6 6 7   CREATININE 0.72 0.74 0.67 0.65 0.63  CALCIUM 9.0 8.6* 9.2 9.0 9.5  MG  --  2.0  --   --   --    Liver Function Tests: Recent Labs  Lab 09/23/24 0414  AST 11*  ALT 8  ALKPHOS 70  BILITOT 0.4  PROT 7.1  ALBUMIN 3.7   CBC: Recent Labs  Lab 09/22/24 1913 09/23/24 0414 09/24/24 0307 09/25/24 0508 09/26/24 0436 09/27/24 0546  WBC 12.4* 14.5* 12.3* 13.7* 9.5 8.6  NEUTROABS 8.9*  --   --   --   --   --   HGB 13.7 12.3 11.2* 12.1 11.2* 12.2  HCT 42.7 38.9 36.5 40.0 36.7 38.4  MCV 74.5* 75.7* 77.7* 77.1* 76.9* 75.3*  PLT 293 305 293 353 351 414*    ProBNP (last 3 results) Recent Labs    09/22/24 2214  PROBNP <50.0    CBG: Recent Labs  Lab 09/23/24 0537  GLUCAP 106*     IMAGING STUDIES ECHOCARDIOGRAM COMPLETE Result Date: 09/23/2024    ECHOCARDIOGRAM REPORT   Patient Name:   CHANELLE HODSDON Date of Exam: 09/23/2024 Medical Rec #:  [id number]    Height:       64.0 in Accession #:    [id number]   Weight:       240.3 lb Date of Birth:  2001/04/20   BSA:          2.115 m Patient Age:    22 years     BP:           122/66 mmHg Patient Gender: F            HR:           104 bpm. Exam Location:  Inpatient Procedure: 2D Echo, Cardiac Doppler and Color Doppler (Both Spectral and Color            Flow Doppler were  utilized during procedure). Indications:    I26.02 Pulmonary embolus  History:        Patient has no prior history of Echocardiogram examinations.                 Signs/Symptoms:Dyspnea.  Sonographer:    Ellouise Mose RDCS Referring Phys: [phone] MATTHEW R HUNSUCKER  Sonographer Comments: Technically difficult study due to poor echo windows, patient is obese and no subcostal window. Image acquisition challenging due to patient  body habitus. Very difficult apicals due to habitus and restrictive clothing. IMPRESSIONS  1. Left ventricular ejection fraction, by estimation, is 60 to 65%. Left ventricular ejection fraction by 2D MOD biplane is 67.0 %. Left ventricular ejection fraction by PLAX is 67 %. The left ventricle has normal function. The left ventricle has no regional wall motion abnormalities. Left ventricular diastolic parameters were normal.  2. Right ventricular systolic function is normal. The right ventricular size is normal. There is normal pulmonary artery systolic pressure. The estimated right ventricular systolic pressure is 32.4 mmHg.  3. The mitral valve is normal in structure. No evidence of mitral valve regurgitation. No evidence of mitral stenosis.  4. The aortic valve is tricuspid. Aortic valve regurgitation is not visualized. No aortic stenosis is present.  5. The inferior vena cava is normal in size with <50% respiratory variability, suggesting right atrial pressure of 8 mmHg. Comparison(s): No prior Echocardiogram. FINDINGS  Left Ventricle: Left ventricular ejection fraction, by estimation, is 60 to 65%. Left ventricular ejection fraction by PLAX is 67 %. Left ventricular ejection fraction by 2D MOD biplane is 67.0 %. The left ventricle has normal function. The left ventricle has no regional wall motion abnormalities. The left ventricular internal cavity size was normal in size. There is borderline left ventricular hypertrophy. Left ventricular diastolic parameters were normal. Right Ventricle: The  right ventricular size is normal. No increase in right ventricular wall thickness. Right ventricular systolic function is normal. There is normal pulmonary artery systolic pressure. The tricuspid regurgitant velocity is 2.47 m/s, and  with an assumed right atrial pressure of 8 mmHg, the estimated right ventricular systolic pressure is 32.4 mmHg. Left Atrium: Left atrial size was normal in size. Right Atrium: Right atrial size was normal in size. Pericardium: There is no evidence of pericardial effusion. Mitral Valve: The mitral valve is normal in structure. No evidence of mitral valve regurgitation. No evidence of mitral valve stenosis. Tricuspid Valve: The tricuspid valve is normal in structure. Tricuspid valve regurgitation is trivial. No evidence of tricuspid stenosis. Aortic Valve: The aortic valve is tricuspid. Aortic valve regurgitation is not visualized. No aortic stenosis is present. Pulmonic Valve: The pulmonic valve was normal in structure. Pulmonic valve regurgitation is not visualized. No evidence of pulmonic stenosis. Aorta: The aortic root and ascending aorta are structurally normal, with no evidence of dilitation. Venous: The inferior vena cava is normal in size with less than 50% respiratory variability, suggesting right atrial pressure of 8 mmHg. IAS/Shunts: No atrial level shunt detected by color flow Doppler.  LEFT VENTRICLE PLAX 2D                        Biplane EF (MOD) LV EF:         Left            LV Biplane EF:   Left                ventricular                      ventricular                ejection                         ejection                fraction by  fraction by                PLAX is 67                       2D MOD                %.                               biplane is LVIDd:         4.30 cm                          67.0 %. LVIDs:         2.70 cm LV PW:         1.00 cm         Diastology LV IVS:        1.10 cm         LV e' medial:    9.79 cm/s LVOT diam:      2.20 cm         LV E/e' medial:  7.8 LV SV:         71              LV e' lateral:   14.90 cm/s LV SV Index:   33              LV E/e' lateral: 5.1 LVOT Area:     3.80 cm  LV Volumes (MOD) LV vol d, MOD    77.9 ml A2C: LV vol d, MOD    80.0 ml A4C: LV vol s, MOD    23.2 ml A2C: LV vol s, MOD    28.0 ml A4C: LV SV MOD A2C:   54.7 ml LV SV MOD A4C:   80.0 ml LV SV MOD BP:    53.4 ml RIGHT VENTRICLE             IVC RV S prime:     14.40 cm/s  IVC diam: 2.00 cm TAPSE (M-mode): 2.2 cm LEFT ATRIUM             Index        RIGHT ATRIUM           Index LA diam:        2.80 cm 1.32 cm/m   RA Area:     12.40 cm LA Vol (A2C):   24.7 ml 11.68 ml/m  RA Volume:   27.60 ml  13.05 ml/m LA Vol (A4C):   17.8 ml 8.42 ml/m LA Biplane Vol: 22.1 ml 10.45 ml/m  AORTIC VALVE LVOT Vmax:   116.00 cm/s LVOT Vmean:  75.800 cm/s LVOT VTI:    0.186 m  AORTA Ao Root diam: 2.90 cm Ao Asc diam:  2.80 cm MITRAL VALVE               TRICUSPID VALVE MV Area (PHT): 4.80 cm    TR Peak grad:   24.4 mmHg MV Decel Time: 158 msec    TR Vmax:        247.00 cm/s MV E velocity: 76.45 cm/s MV A velocity: 75.80 cm/s  SHUNTS MV E/A ratio:  1.01        Systemic VTI:  0.19 m  Systemic Diam: 2.20 cm Dalton McleanMD Electronically signed by Ezra Kanner Signature Date/Time: 09/23/2024/7:25:05 PM    Final    VAS US  LOWER EXTREMITY VENOUS (DVT) Result Date: 09/23/2024  Lower Venous DVT Study Patient Name:  KAGAN HIETPAS  Date of Exam:   09/23/2024 Medical Rec #: [id number]     Accession #:    [id number] Date of Birth: 21-Jun-2001    Patient Gender: F Patient Age:   22 years Exam Location:  [facility] Procedure:      VAS US  LOWER EXTREMITY VENOUS (DVT) Referring Phys: CAMILA NED --------------------------------------------------------------------------------  Indications: Pulmonary embolism.  Risk Factors: Oral contraceptive use. Comparison Study: No previous exams Performing Technologist: Jody Hill RVT, RDMS   Examination Guidelines: A complete evaluation includes B-mode imaging, spectral Doppler, color Doppler, and power Doppler as needed of all accessible portions of each vessel. Bilateral testing is considered an integral part of a complete examination. Limited examinations for reoccurring indications may be performed as noted. The reflux portion of the exam is performed with the patient in reverse Trendelenburg.  +---------+---------------+---------+-----------+----------+--------------+ RIGHT    CompressibilityPhasicitySpontaneityPropertiesThrombus Aging +---------+---------------+---------+-----------+----------+--------------+ CFV      Full           Yes      Yes                                 +---------+---------------+---------+-----------+----------+--------------+ SFJ      Full                                                        +---------+---------------+---------+-----------+----------+--------------+ FV Prox  Full           Yes      Yes                                 +---------+---------------+---------+-----------+----------+--------------+ FV Mid   Full           Yes      Yes                                 +---------+---------------+---------+-----------+----------+--------------+ FV DistalFull           Yes      Yes                                 +---------+---------------+---------+-----------+----------+--------------+ PFV      Full                                                        +---------+---------------+---------+-----------+----------+--------------+ POP      Full           Yes      Yes                                 +---------+---------------+---------+-----------+----------+--------------+ PTV      Full                                                        +---------+---------------+---------+-----------+----------+--------------+  PERO     Full                                                         +---------+---------------+---------+-----------+----------+--------------+   +---------+---------------+---------+-----------+----------+--------------+ LEFT     CompressibilityPhasicitySpontaneityPropertiesThrombus Aging +---------+---------------+---------+-----------+----------+--------------+ CFV      Full           Yes      Yes                                 +---------+---------------+---------+-----------+----------+--------------+ SFJ      Full                                                        +---------+---------------+---------+-----------+----------+--------------+ FV Prox  Full           Yes      Yes                                 +---------+---------------+---------+-----------+----------+--------------+ FV Mid   Full           Yes      Yes                                 +---------+---------------+---------+-----------+----------+--------------+ FV DistalFull           Yes      Yes                                 +---------+---------------+---------+-----------+----------+--------------+ PFV      Full                                                        +---------+---------------+---------+-----------+----------+--------------+ POP      Full           Yes      Yes                                 +---------+---------------+---------+-----------+----------+--------------+ PTV      Full                                                        +---------+---------------+---------+-----------+----------+--------------+ PERO     Full                                                        +---------+---------------+---------+-----------+----------+--------------+  Summary: BILATERAL: - No evidence of deep vein thrombosis seen in the lower extremities, bilaterally. -No evidence of popliteal cyst, bilaterally.   *See table(s) above for measurements and observations. Electronically signed by Lonni Gaskins MD on 09/23/2024 at  2:25:50 PM.    Final    CT Angio Chest PE W/Cm &/Or Wo Cm Result Date: 09/22/2024 EXAM: CTA CHEST 09/22/2024 08:54:38 PM TECHNIQUE: CTA of the chest was performed after the administration of intravenous contrast (iohexol  (OMNIPAQUE ) 350 MG/ML injection 80 mL IOHEXOL  350 MG/ML SOLN). Multiplanar reformatted images are provided for review. MIP images are provided for review. Automated exposure control, iterative reconstruction, and/or weight based adjustment of the mA/kV was utilized to reduce the radiation dose to as low as reasonably achievable. COMPARISON: None available. CLINICAL HISTORY: Pulmonary embolism (PE) suspected, low to intermediate prob, positive D-dimer. PE suspected, elevated Ddimer, lower leg and feet swelling, tachycardic. FINDINGS: PULMONARY ARTERIES: There are branching intraluminal filling defects identified within the lobar and segmental pulmonary arteries of the lower lobes bilaterally in keeping with acute pulmonary emboli. The embolic burden is moderate. MEDIASTINUM: No CT evidence of right heart strain. No significant coronary artery calcification. Global cardiac size within normal limits. No pericardial effusion. Thoracic Aorta is unremarkable. LYMPH NODES: No pathologic thoracic adenopathy. LUNGS AND PLEURA: Patchy ground glass opacity within the basilar lingula, which may represent atelectasis or infiltrate in the setting of a developing pulmonary infarct. Bibasilar atelectasis noted. No pneumothorax or pleural effusion. No central obstructing lesion. UPPER ABDOMEN: Limited images of the upper abdomen are unremarkable. SOFT TISSUES AND BONES: No acute bone abnormality. No lytic or blastic bone lesion. IMPRESSION: 1. Acute pulmonary emboli involving the lobar and segmental pulmonary arteries of the lower lobes bilaterally, with moderate embolic burden. No CT evidence of right heart strain. 2. Patchy ground glass opacity within the basilar lingula, possibly representing atelectasis or  infiltrate in the setting of a developing pulmonary infarct.\ 3. Findings were discussed personally with Dr. Lenor at time of dictation Electronically signed by: Dorethia Molt MD 09/22/2024 09:16 PM EDT RP Workstation: [id number]   DG Chest 2 View Result Date: 09/22/2024 CLINICAL DATA:  SHOB EXAM: CHEST - 2 VIEW COMPARISON:  None available. FINDINGS: Lower lung volumes. Bilateral perihilar interstitial opacities. No focal airspace consolidation, pleural effusion, or pneumothorax. No cardiomegaly.No acute fracture or destructive lesion. IMPRESSION: Bilateral perihilar interstitial opacities, which may represent bronchovascular crowding due to low lung volumes, atypical/viral infection, or changes of asthma. Electronically Signed   By: Rogelia Myers M.D.   On: 09/22/2024 17:29    DISCHARGE EXAMINATION: Vitals:   09/27/24 0537 09/27/24 0540 09/27/24 0542 09/27/24 0600  BP:      Pulse:      Resp: 14 18 15  (!) 22  Temp:      TempSrc:      SpO2:      Weight:      Height:       General appearance: Awake alert.  In no distress Resp: Clear to auscultation bilaterally.  Normal effort Cardio: S1-S2 is normal regular.  No S3-S4.  No rubs murmurs or bruit GI: Abdomen is soft.  Nontender nondistended.  Bowel sounds are present normal.  No masses organomegaly Extremities: No edema.  Full range of motion of lower extremities. Neurologic: Alert and oriented x3.  No focal neurological deficits.    DISPOSITION: Home with family  Discharge Instructions     Ambulatory referral to Hematology / Oncology   Complete by: As directed    Need  outpatient hematology consult in this young patient with PE. On OCP. Family h/o VTE.   Call MD for:  difficulty breathing, headache or visual disturbances   Complete by: As directed    Call MD for:  extreme fatigue   Complete by: As directed    Call MD for:  persistant dizziness or light-headedness   Complete by: As directed    Call MD for:  persistant nausea and  vomiting   Complete by: As directed    Call MD for:  severe uncontrolled pain   Complete by: As directed    Call MD for:  temperature >100.4   Complete by: As directed    Diet - low sodium heart healthy   Complete by: As directed    Discharge instructions   Complete by: As directed    Referral has been sent to hematology at [facility].  If you do not hear from them please reach out to your primary care provider.  Take your medications as prescribed.  Monitor for signs of bleeding as discussed.  Stop your oral contraceptive tablets.  No exertional activities for the next 2 weeks.  You were cared for by a hospitalist during your hospital stay. If you have any questions about your discharge medications or the care you received while you were in the hospital after you are discharged, you can call the unit and asked to speak with the hospitalist on call if the hospitalist that took care of you is not available. Once you are discharged, your primary care physician will handle any further medical issues. Please note that NO REFILLS for any discharge medications will be authorized once you are discharged, as it is imperative that you return to your primary care physician (or establish a relationship with a primary care physician if you do not have one) for your aftercare needs so that they can reassess your need for medications and monitor your lab values. If you do not have a primary care physician, you can call [phone] for a physician referral.   Increase activity slowly   Complete by: As directed          Allergies as of 09/27/2024       Reactions   Shellfish-derived Products Anaphylaxis        Medication List     STOP taking these medications    clindamycin 300 MG capsule Commonly known as: CLEOCIN       TAKE these medications    acetaminophen  325 MG tablet Commonly known as: TYLENOL  Take 2 tablets (650 mg total) by mouth every 6 (six) hours as needed for mild  pain (pain score 1-3) (or Fever >/= 101).   EPINEPHrine  0.3 mg/0.3 mL Soaj injection Commonly known as: EPI-PEN Inject 0.3 mg into the muscle as needed for anaphylaxis.   HYDROcodone -acetaminophen  5-325 MG tablet Commonly known as: NORCO/VICODIN Take 1-2 tablets by mouth every 6 (six) hours as needed for severe pain (pain score 7-10).   metoprolol succinate 25 MG 24 hr tablet Commonly known as: Toprol XL Take 1 tablet (25 mg total) by mouth daily.   sertraline  25 MG tablet Commonly known as: ZOLOFT  Take 25 mg by mouth daily.   Vitamin D (Ergocalciferol) 1.25 MG (50000 UNIT) Caps capsule Commonly known as: DRISDOL Take 50,000 Units by mouth every 7 (seven) days.   Xarelto Starter Pack Generic drug: Rivaroxaban Starter Pack (15 mg and 20 mg) Follow package directions: Take one 15mg  tablet by mouth twice a day. On  day 22, switch to one 20mg  tablet once a day. Take with food.   rivaroxaban 20 MG Tabs tablet Commonly known as: XARELTO Take 1 tablet (20 mg total) by mouth daily with supper. Please start this course only after you have completed the starter pack. Start taking on: October 25, 2024          Follow-up Information     Kline, Chianne, PA-C. Schedule an appointment as soon as possible for a visit in 1 week(s).   Specialty: Physician Assistant Why: post hospitalization follow up Contact information: [address] [place] [address] [phone]                 TOTAL DISCHARGE TIME: 35 minutes  Easter Schinke Verdene  [facility] Pager on [email or web address]  09/28/2024, 11:10 AM

## 2024-09-27 NOTE — Progress Notes (Signed)
Before ambulation

## 2024-09-27 NOTE — Plan of Care (Signed)
  Problem: Education: Goal: Knowledge of General Education information will improve Description: Including pain rating scale, medication(s)/side effects and non-pharmacologic comfort measures Outcome: Progressing   Problem: Clinical Measurements: Goal: Ability to maintain clinical measurements within normal limits will improve Outcome: Progressing Goal: Will remain free from infection Outcome: Progressing   Problem: Activity: Goal: Risk for activity intolerance will decrease Outcome: Progressing   Problem: Coping: Goal: Level of anxiety will decrease Outcome: Progressing   Problem: Pain Managment: Goal: General experience of comfort will improve and/or be controlled Outcome: Progressing   Problem: Safety: Goal: Ability to remain free from injury will improve Outcome: Progressing

## 2024-09-27 NOTE — Progress Notes (Signed)
 Discharge meds in a secure bag delivered to pt in room by this RN- ride enroute

## 2024-09-27 NOTE — Plan of Care (Signed)

## 2024-09-28 ENCOUNTER — Emergency Department (HOSPITAL_BASED_OUTPATIENT_CLINIC_OR_DEPARTMENT_OTHER)
Admission: EM | Admit: 2024-09-28 | Discharge: 2024-09-28 | Disposition: A | Discharge status: Home / Self Care | Attending: Emergency Medicine | Admitting: Emergency Medicine

## 2024-09-28 ENCOUNTER — Emergency Department (HOSPITAL_BASED_OUTPATIENT_CLINIC_OR_DEPARTMENT_OTHER)

## 2024-09-28 ENCOUNTER — Encounter (HOSPITAL_BASED_OUTPATIENT_CLINIC_OR_DEPARTMENT_OTHER): Payer: Self-pay | Admitting: Emergency Medicine

## 2024-09-28 ENCOUNTER — Other Ambulatory Visit: Payer: Self-pay

## 2024-09-28 DIAGNOSIS — I10 Essential (primary) hypertension: Secondary | ICD-10-CM | POA: Insufficient documentation

## 2024-09-28 DIAGNOSIS — Z86711 Personal history of pulmonary embolism: Secondary | ICD-10-CM | POA: Diagnosis not present

## 2024-09-28 DIAGNOSIS — R519 Headache, unspecified: Secondary | ICD-10-CM | POA: Diagnosis present

## 2024-09-28 DIAGNOSIS — Z7901 Long term (current) use of anticoagulants: Secondary | ICD-10-CM | POA: Insufficient documentation

## 2024-09-28 NOTE — Discharge Instructions (Signed)
 Please read and follow all provided instructions.  Your diagnoses today include:  1. Acute nonintractable headache, unspecified headache type   2. Primary hypertension     Tests performed today include: CT of your head which was normal and did not show any serious cause of your headache such as bleeding Vital signs. See below for your results today.   Medications:  Please take your metoprolol every day at the same time.  If the top number of your blood pressure is above 180, or the bottom number is above 120, and your pulse rate is greater than 70 --you may consider taking 1 additional tablet of your blood pressure medication, until you can follow-up with your primary care doctor.  Please be aware that this medication will lower your heart rate and if that goes too low it can make you feel lightheaded or dizzy.  Additional information:  Follow any educational materials contained in this packet.  You are having a headache. No specific cause was found today for your headache. It may have been a migraine or other cause of headache. Stress, anxiety, fatigue, and depression are common triggers for headaches.   Your headache today does not appear to be life-threatening or require hospitalization, but often the exact cause of headaches is not determined in the emergency department. Therefore, follow-up with your doctor is very important to find out what may have caused your headache and whether or not you need any further diagnostic testing or treatment.   Sometimes headaches can appear benign (not harmful), but then more serious symptoms can develop which should prompt an immediate re-evaluation by your doctor or the emergency department.  BE VERY CAREFUL not to take multiple medicines containing Tylenol  (also called acetaminophen ). Doing so can lead to an overdose which can damage your liver and cause liver failure and possibly death.   Follow-up instructions: Please follow-up with your primary  care provider for continued management of your blood pressure and treatment of your pulmonary emboli.  Return instructions:  Please return to the Emergency Department if you experience worsening symptoms. Return if the medications do not resolve your headache, if it recurs, or if you have multiple episodes of vomiting or cannot keep down fluids. Return if you have a change from the usual headache. RETURN IMMEDIATELY IF you: Develop a sudden, severe headache Develop confusion or become poorly responsive or faint Develop a fever above 100.54F or problem breathing Have a change in speech, vision, swallowing, or understanding Develop new weakness, numbness, tingling, incoordination in your arms or legs Have a seizure Please return if you have any other emergent concerns.  Additional Information:  Your vital signs today were: BP (!) 155/105   Pulse 79   Temp 98.9 F (37.2 C) (Oral)   Resp 15   Ht 5' 4 (1.626 m)   Wt 109 kg   LMP 09/25/2024 (Approximate)   SpO2 98%   BMI 41.25 kg/m  If your blood pressure (BP) was elevated above 135/85 this visit, please have this repeated by your doctor within one month. --------------

## 2024-09-28 NOTE — ED Triage Notes (Signed)
 Pt c/o elevated bp this AM when checked at home, also c/o headache since appx 1200 today.   D/c from [facility] yesterday due to BL PE.  Currently taking blood thinner. Also c/o slight cough.   Reports compliance with medications.

## 2024-09-28 NOTE — ED Provider Notes (Signed)
 [place] [facility] Provider Note   CSN: [id number] Arrival date & time: 09/28/24  1524     Patient presents with: Headache and Hypertension   Pamela Berg is a 23 y.o. female.   Patient to the emergency department for evaluation of headache, elevated blood pressure.  Patient was admitted to the hospital 9/24 - 09/27/2024 for pulmonary embolism.  Patient was started on Xarelto.  She was also started on metoprolol for hypertension.  Patient has been checking her blood pressure and this morning it was 202 systolic.  Patient took her blood pressure medicine at 8:30 AM today.  She developed a headache, mainly left-sided.  This was not thunderclap onset.  She has not had associated confusion or vomiting, associated neck pain, or any strokelike symptoms.  Family member called PCP, referred to the emergency department to be checked.       Prior to Admission medications   Medication Sig Start Date End Date Taking? Authorizing Provider  acetaminophen  (TYLENOL ) 325 MG tablet Take 2 tablets (650 mg total) by mouth every 6 (six) hours as needed for mild pain (pain score 1-3) (or Fever >/= 101). 09/27/24   Verdene Purchase, MD  EPINEPHrine  0.3 mg/0.3 mL IJ SOAJ injection Inject 0.3 mg into the muscle as needed for anaphylaxis. 06/16/24   [provider]  HYDROcodone -acetaminophen  (NORCO/VICODIN) 5-325 MG tablet Take 1-2 tablets by mouth every 6 (six) hours as needed for severe pain (pain score 7-10). 09/27/24   Krishnan, Gokul, MD  metoprolol succinate (TOPROL XL) 25 MG 24 hr tablet Take 1 tablet (25 mg total) by mouth daily. 09/27/24 09/27/25  Krishnan, Gokul, MD  rivaroxaban (XARELTO) 20 MG TABS tablet Take 1 tablet (20 mg total) by mouth daily with supper. Please start this course only after you have completed the starter pack. 10/25/24   Krishnan, Gokul, MD  RIVAROXABAN (XARELTO) VTE STARTER PACK (15 & 20 MG) Follow package directions: Take one 15mg  tablet by  mouth twice a day. On day 22, switch to one 20mg  tablet once a day. Take with food. 09/27/24   Krishnan, Gokul, MD  sertraline  (ZOLOFT ) 25 MG tablet Take 25 mg by mouth daily. 09/20/24   [provider]  Vitamin D, Ergocalciferol, (DRISDOL) 1.25 MG (50000 UNIT) CAPS capsule Take 50,000 Units by mouth every 7 (seven) days. 06/15/18   [provider]    Allergies: Shellfish-derived products    Review of Systems  Updated Vital Signs BP (!) 150/111   Pulse 99   Temp 98.9 F (37.2 C) (Oral)   Resp 15   Ht 5' 4 (1.626 m)   Wt 109 kg   LMP 09/25/2024 (Approximate)   SpO2 97%   BMI 41.25 kg/m   Physical Exam Vitals and nursing note reviewed.  Constitutional:      Appearance: She is well-developed.  HENT:     Head: Normocephalic and atraumatic.     Right Ear: External ear normal.     Left Ear: External ear normal.     Nose: Nose normal.     Mouth/Throat:     Pharynx: Uvula midline.  Eyes:     General: Lids are normal.     Extraocular Movements:     Right eye: No nystagmus.     Left eye: No nystagmus.     Conjunctiva/sclera: Conjunctivae normal.     Pupils: Pupils are equal, round, and reactive to light.  Neck:     Comments: Full range of motion of  neck, no meningismus. Cardiovascular:     Rate and Rhythm: Normal rate and regular rhythm.  Pulmonary:     Effort: Pulmonary effort is normal.     Breath sounds: Normal breath sounds.  Abdominal:     Palpations: Abdomen is soft.     Tenderness: There is no abdominal tenderness.  Musculoskeletal:     Cervical back: Normal range of motion and neck supple. No tenderness or bony tenderness.  Skin:    General: Skin is warm and dry.  Neurological:     Mental Status: She is alert and oriented to person, place, and time.     GCS: GCS eye subscore is 4. GCS verbal subscore is 5. GCS motor subscore is 6.     Cranial Nerves: No cranial nerve deficit.     Sensory: No sensory deficit.     Motor: No weakness.      Coordination: Coordination normal.     Gait: Gait normal.     Comments: Upper extremity myotomes tested bilaterally:  C5 Shoulder abduction 5/5 C6 Elbow flexion/wrist extension 5/5 C7 Elbow extension 5/5 C8 Finger flexion 5/5 T1 Finger abduction 5/5  Lower extremity myotomes tested bilaterally: L2 Hip flexion 5/5 L3 Knee extension 5/5 L4 Ankle dorsiflexion 5/5 S1 Ankle plantar flexion 5/5      (all labs ordered are listed, but only abnormal results are displayed) Labs Reviewed - No data to display  EKG: None  Radiology: No results found.   Procedures   Medications Ordered in the ED - No data to display  ED Course  Patient seen and examined. History obtained directly from patient and family member.  Reviewed recent hospital discharge summary.  Labs/EKG: None ordered  Imaging: Ordered CT head due to new headache, recent initiation of anticoagulation  Medications/Fluids: None ordered  Most recent vital signs reviewed and are as follows: BP (!) 150/111   Pulse 99   Temp 98.9 F (37.2 C) (Oral)   Resp 15   Ht 5' 4 (1.626 m)   Wt 109 kg   LMP 09/25/2024 (Approximate)   SpO2 97%   BMI 41.25 kg/m   Initial impression: Headache, new recent anticoagulant, otherwise reassuring exam and no other red flags.  Patient remains hypertensive, 150/111 on recent check.  Discussed that I will take time to determine effect of new metoprolol.  Patient has been tolerating this well so far.  We did discuss that she could potentially take an additional dose if blood pressure greater than 180/120 at home as long as her heart rate is above 70.  Currently will monitor blood pressure.  5:15 PM Reassessment performed. Patient appears stable,  comfortable.  Family at bedside.  Imaging personally visualized and interpreted including: CT head, agree negative.  Reviewed pertinent lab work and imaging with patient at bedside. Questions answered.   Most current vital signs reviewed and  are as follows: BP (!) 155/105   Pulse 79   Temp 98.9 F (37.2 C) (Oral)   Resp 15   Ht 5' 4 (1.626 m)   Wt 109 kg   LMP 09/25/2024 (Approximate)   SpO2 98%   BMI 41.25 kg/m   Plan: Discharge to home.   Prescriptions written for: None can  Other home care instructions discussed: Continued adherence to medication regimen including anticoagulant.  We discussed possibly taking an additional tablet of her blood pressure medication, if her blood pressure is elevated above 180/120, as long as heart rate is above 70.  Once she can follow-up  with her PCP, they can make additional adjustments.  ED return instructions discussed: Patient counseled to return if they have weakness in their arms or legs, slurred speech, trouble walking or talking, confusion, trouble with their balance, or if they have any other concerns. Patient verbalizes understanding and agrees with plan.   Follow-up instructions discussed: Patient encouraged to follow-up with their PCP in 7 days.                                   Medical Decision Making Amount and/or Complexity of Data Reviewed Radiology: ordered.   Pulmonary embolism: Patient seems stable in this regard.  She is compliant with her medications.  No worsening shortness of breath or pulmonary symptoms.  Heart rate is around 100.  Hypertension: Patient recently started on metoprolol.  Will need to see where her blood pressure stabilizes after taking for a week or 2.  We discussed rescue dose if blood pressure is markedly elevated.  Discussed pulse rate considerations.  In regards to the patient's headache, critical differentials were considered including subarachnoid hemorrhage, intracerebral hemorrhage, epidural/subdural hematoma, pituitary apoplexy, vertebral/carotid artery dissection, giant cell arteritis, central venous thrombosis, reversible cerebral vasoconstriction, acute angle closure glaucoma, idiopathic intracranial hypertension, bacterial  meningitis, viral encephalitis, carbon monoxide poisoning, posterior reversible encephalopathy syndrome, pre-eclampsia.   Reg flag symptoms related to these causes were considered including systemic symptoms (fever, weight loss), neurologic symptoms (confusion, mental status change, vision change, associated seizure), acute or sudden thunderclap onset, patient age 58 or older with new or progressive headache, patient of any age with first headache or change in headache pattern, pregnant or postpartum status, history of HIV or other immunocompromise, history of cancer, headache occurring with exertion, associated neck or shoulder pain, associated traumatic injury, concurrent use of anticoagulation, family history of spontaneous SAH, and concurrent drug use.    Other benign, more common causes of headache were considered including migraine, tension-type headache, cluster headache, referred pain from other cause such as sinus infection, dental pain, trigeminal neuralgia.   On exam, patient has a reassuring neuro exam including baseline mental status, no significant neck pain or meningeal signs, no signs of severe infection or fever.   CT was reassuring.  The patient's vital signs, pertinent lab work and imaging were reviewed and interpreted as discussed in the ED course. Hospitalization was considered for further testing, treatments, or serial exams/observation. However as patient is well-appearing, has a stable exam over the course of their evaluation, and reassuring studies today, I do not feel that they warrant admission at this time. This plan was discussed with the patient who verbalizes agreement and comfort with this plan and seems reliable and able to return to the Emergency Department with worsening or changing symptoms.       Final diagnoses:  Acute nonintractable headache, unspecified headache type  Primary hypertension    ED Discharge Orders     None          Desiderio Chew,  PA-C 09/28/24 1717    Yolande Lamar BROCKS, MD 10/03/24 1313

## 2024-10-18 ENCOUNTER — Other Ambulatory Visit

## 2024-10-18 ENCOUNTER — Inpatient Hospital Stay: Attending: Internal Medicine | Admitting: Internal Medicine

## 2024-10-18 VITALS — BP 130/79 | HR 72 | Temp 97.8°F | Resp 17 | Ht 64.0 in | Wt 241.0 lb

## 2024-10-18 DIAGNOSIS — I2699 Other pulmonary embolism without acute cor pulmonale: Secondary | ICD-10-CM

## 2024-10-18 NOTE — Progress Notes (Signed)
 [facility] [phone]   [phone]  CONSULT NOTE  REFERRING PHYSICIAN: Sharma Specking, PA-C  REASON FOR CONSULTATION:  23 years old African-American female with history of pulmonary embolus  HPI Pamela Berg is a 23 y.o. female.   HPI  Discussed the use of AI scribe software for clinical note transcription with the patient, who gave verbal consent to proceed.  History of Present Illness Pamela Berg is a 23 year old female with a history of pulmonary embolism who presents for evaluation of her condition.  She experienced sharp pain in the left side of her chest and left shoulder a few days before her diagnosis, initially attributing it to a pulled muscle. The pain worsened, and while driving to pick up her stepmother, she experienced severe shortness of breath, prompting her to seek emergency medical care.  On September 22, 2024, a chest angiogram revealed an acute pulmonary embolism involving the lobar and segmental pulmonary arteries of the lower lobes bilaterally, with a moderate embolic burden. A Doppler ultrasound of her legs was performed at that time. She was initially treated with IV heparin  and later transitioned to Xarelto. She started the Xarelto starter pack, taking it twice daily, and has recently switched to a once-daily regimen. She has been on Xarelto since September 27, 2024.  She denies any recent travel, prolonged immobility, or illness prior to the clot. She is a full-time Designer, jewellery, leading a sedentary lifestyle, often sitting for long periods without a desk. Her family history is significant for her mother having recurrent blood clots, treated with Xarelto. She was on Junel, an oral contraceptive with hormones, since age 77 or 51, but has stopped due to the risk of blood clots.  No current symptoms such as chest pain, shortness of breath, cough, nausea, vomiting, diarrhea, or bleeding disorders. She was recently diagnosed  with hypertension and is taking metoprolol. She has no history of diabetes, heart attacks, or strokes.  The patient is single and has no children.  She is a Consulting civil engineer at [employer].  She has no history of smoking but drinks alcohol occasionally and no history of drug abuse  Past Medical History:  Diagnosis Date   Premature baby       No past surgical history on file.  Family History  Problem Relation Age of Onset   Depression Mother    ADD / ADHD Mother    Hypertension Mother    Vision loss Mother    Cancer Maternal Aunt    Diabetes Maternal Grandmother    Arthritis Maternal Grandmother    Hypertension Maternal Grandmother    Miscarriages / Stillbirths Maternal Grandmother    Diabetes Maternal Grandfather    Stroke Maternal Grandfather    Arthritis Maternal Grandfather    Hypertension Maternal Grandfather     Social History Social History   Tobacco Use   Smoking status: Never    Passive exposure: Never   Smokeless tobacco: Never  Substance Use Topics   Alcohol use: No   Drug use: Never    Allergies  Allergen Reactions   Shellfish Protein-Containing Drug Products Anaphylaxis    Current Outpatient Medications  Medication Sig Dispense Refill   acetaminophen  (TYLENOL ) 325 MG tablet Take 2 tablets (650 mg total) by mouth every 6 (six) hours as needed for mild pain (pain score 1-3) (or Fever >/= 101).     EPINEPHrine  0.3 mg/0.3 mL IJ SOAJ injection Inject 0.3 mg into the muscle as  needed for anaphylaxis.     HYDROcodone -acetaminophen  (NORCO/VICODIN) 5-325 MG tablet Take 1-2 tablets by mouth every 6 (six) hours as needed for severe pain (pain score 7-10). 30 tablet 0   metoprolol succinate (TOPROL XL) 25 MG 24 hr tablet Take 1 tablet (25 mg total) by mouth daily. 30 tablet 11   [START ON 10/25/2024] rivaroxaban (XARELTO) 20 MG TABS tablet Take 1 tablet (20 mg total) by mouth daily with supper. Please start this course only after you have  completed the starter pack. 30 tablet 3   RIVAROXABAN (XARELTO) VTE STARTER PACK (15 & 20 MG) Follow package directions: Take one 15mg  tablet by mouth twice a day. On day 22, switch to one 20mg  tablet once a day. Take with food. 51 each 0   sertraline  (ZOLOFT ) 25 MG tablet Take 25 mg by mouth daily.     Vitamin D, Ergocalciferol, (DRISDOL) 1.25 MG (50000 UNIT) CAPS capsule Take 50,000 Units by mouth every 7 (seven) days.     No current facility-administered medications for this visit.    Review of Systems  Constitutional: negative Eyes: negative Ears, nose, mouth, throat, and face: negative Respiratory: negative Cardiovascular: negative Gastrointestinal: negative Genitourinary:negative Integument/breast: negative Hematologic/lymphatic: negative Musculoskeletal:negative Neurological: negative Behavioral/Psych: negative Endocrine: negative Allergic/Immunologic: negative  Physical Exam  MJO:jozmu, healthy, no distress, well nourished, well developed, and obese SKIN: skin color, texture, turgor are normal, no rashes or significant lesions HEAD: Normocephalic, No masses, lesions, tenderness or abnormalities EYES: normal, PERRLA, Conjunctiva are pink and non-injected EARS: External ears normal, Canals clear OROPHARYNX:no exudate, no erythema, and lips, buccal mucosa, and tongue normal  NECK: supple, no adenopathy, no JVD LYMPH:  no palpable lymphadenopathy, no hepatosplenomegaly BREAST:not examined LUNGS: clear to auscultation , and palpation HEART: regular rate & rhythm, no murmurs, and no gallops ABDOMEN:abdomen soft, non-tender, obese, normal bowel sounds, and no masses or organomegaly BACK: Back symmetric, no curvature., No CVA tenderness EXTREMITIES:no joint deformities, effusion, or inflammation, no edema  NEURO: alert & oriented x 3 with fluent speech, no focal motor/sensory deficits  PERFORMANCE STATUS: ECOG 1  LABORATORY DATA: Lab Results  Component Value Date   WBC  8.6 09/27/2024   HGB 12.2 09/27/2024   HCT 38.4 09/27/2024   MCV 75.3 (L) 09/27/2024   PLT 414 (H) 09/27/2024      Chemistry      Component Value Date/Time   NA 137 09/27/2024 0546   K 3.7 09/27/2024 0546   CL 103 09/27/2024 0546   CO2 22 09/27/2024 0546   BUN 7 09/27/2024 0546   CREATININE 0.63 09/27/2024 0546      Component Value Date/Time   CALCIUM 9.5 09/27/2024 0546   ALKPHOS 70 09/23/2024 0414   AST 11 (L) 09/23/2024 0414   ALT 8 09/23/2024 0414   BILITOT 0.4 09/23/2024 0414       RADIOGRAPHIC STUDIES: CT Head Wo Contrast Result Date: 09/28/2024 CLINICAL DATA:  Headache EXAM: CT HEAD WITHOUT CONTRAST TECHNIQUE: Contiguous axial images were obtained from the base of the skull through the vertex without intravenous contrast. RADIATION DOSE REDUCTION: This exam was performed according to the departmental dose-optimization program which includes automated exposure control, adjustment of the mA and/or kV according to patient size and/or use of iterative reconstruction technique. COMPARISON:  Brain MRI 08/14/2018 FINDINGS: Brain: No evidence of acute infarction, hemorrhage, hydrocephalus, extra-axial collection or mass lesion/mass effect. Vascular: No hyperdense vessel or unexpected calcification. Skull: Normal. Negative for fracture or focal lesion. Sinuses/Orbits: No acute finding. Other: None  IMPRESSION: Negative non contrasted CT appearance of the brain. Electronically Signed   By: Luke Bun M.D.   On: 09/28/2024 16:50   ECHOCARDIOGRAM COMPLETE Result Date: 09/23/2024    ECHOCARDIOGRAM REPORT   Patient Name:   JESSICIA NAPOLITANO Date of Exam: 09/23/2024 Medical Rec #:  [id number]    Height:       64.0 in Accession #:    [id number]   Weight:       240.3 lb Date of Birth:  23-Feb-2001   BSA:          2.115 m Patient Age:    22 years     BP:           122/66 mmHg Patient Gender: F            HR:           104 bpm. Exam Location:  Inpatient Procedure: 2D Echo, Cardiac Doppler and Color  Doppler (Both Spectral and Color            Flow Doppler were utilized during procedure). Indications:    I26.02 Pulmonary embolus  History:        Patient has no prior history of Echocardiogram examinations.                 Signs/Symptoms:Dyspnea.  Sonographer:    Ellouise Mose RDCS Referring Phys: [phone] MATTHEW R HUNSUCKER  Sonographer Comments: Technically difficult study due to poor echo windows, patient is obese and no subcostal window. Image acquisition challenging due to patient body habitus. Very difficult apicals due to habitus and restrictive clothing. IMPRESSIONS  1. Left ventricular ejection fraction, by estimation, is 60 to 65%. Left ventricular ejection fraction by 2D MOD biplane is 67.0 %. Left ventricular ejection fraction by PLAX is 67 %. The left ventricle has normal function. The left ventricle has no regional wall motion abnormalities. Left ventricular diastolic parameters were normal.  2. Right ventricular systolic function is normal. The right ventricular size is normal. There is normal pulmonary artery systolic pressure. The estimated right ventricular systolic pressure is 32.4 mmHg.  3. The mitral valve is normal in structure. No evidence of mitral valve regurgitation. No evidence of mitral stenosis.  4. The aortic valve is tricuspid. Aortic valve regurgitation is not visualized. No aortic stenosis is present.  5. The inferior vena cava is normal in size with <50% respiratory variability, suggesting right atrial pressure of 8 mmHg. Comparison(s): No prior Echocardiogram. FINDINGS  Left Ventricle: Left ventricular ejection fraction, by estimation, is 60 to 65%. Left ventricular ejection fraction by PLAX is 67 %. Left ventricular ejection fraction by 2D MOD biplane is 67.0 %. The left ventricle has normal function. The left ventricle has no regional wall motion abnormalities. The left ventricular internal cavity size was normal in size. There is borderline left ventricular hypertrophy. Left  ventricular diastolic parameters were normal. Right Ventricle: The right ventricular size is normal. No increase in right ventricular wall thickness. Right ventricular systolic function is normal. There is normal pulmonary artery systolic pressure. The tricuspid regurgitant velocity is 2.47 m/s, and  with an assumed right atrial pressure of 8 mmHg, the estimated right ventricular systolic pressure is 32.4 mmHg. Left Atrium: Left atrial size was normal in size. Right Atrium: Right atrial size was normal in size. Pericardium: There is no evidence of pericardial effusion. Mitral Valve: The mitral valve is normal in structure. No evidence of mitral valve regurgitation. No evidence of mitral valve stenosis. Tricuspid Valve: The tricuspid  valve is normal in structure. Tricuspid valve regurgitation is trivial. No evidence of tricuspid stenosis. Aortic Valve: The aortic valve is tricuspid. Aortic valve regurgitation is not visualized. No aortic stenosis is present. Pulmonic Valve: The pulmonic valve was normal in structure. Pulmonic valve regurgitation is not visualized. No evidence of pulmonic stenosis. Aorta: The aortic root and ascending aorta are structurally normal, with no evidence of dilitation. Venous: The inferior vena cava is normal in size with less than 50% respiratory variability, suggesting right atrial pressure of 8 mmHg. IAS/Shunts: No atrial level shunt detected by color flow Doppler.  LEFT VENTRICLE PLAX 2D                        Biplane EF (MOD) LV EF:         Left            LV Biplane EF:   Left                ventricular                      ventricular                ejection                         ejection                fraction by                      fraction by                PLAX is 67                       2D MOD                %.                               biplane is LVIDd:         4.30 cm                          67.0 %. LVIDs:         2.70 cm LV PW:         1.00 cm         Diastology LV  IVS:        1.10 cm         LV e' medial:    9.79 cm/s LVOT diam:     2.20 cm         LV E/e' medial:  7.8 LV SV:         71              LV e' lateral:   14.90 cm/s LV SV Index:   33              LV E/e' lateral: 5.1 LVOT Area:     3.80 cm  LV Volumes (MOD) LV vol d, MOD    77.9 ml A2C: LV vol d, MOD    80.0 ml A4C: LV vol s, MOD    23.2 ml A2C: LV vol s, MOD    28.0 ml A4C: LV SV MOD A2C:   54.7  ml LV SV MOD A4C:   80.0 ml LV SV MOD BP:    53.4 ml RIGHT VENTRICLE             IVC RV S prime:     14.40 cm/s  IVC diam: 2.00 cm TAPSE (M-mode): 2.2 cm LEFT ATRIUM             Index        RIGHT ATRIUM           Index LA diam:        2.80 cm 1.32 cm/m   RA Area:     12.40 cm LA Vol (A2C):   24.7 ml 11.68 ml/m  RA Volume:   27.60 ml  13.05 ml/m LA Vol (A4C):   17.8 ml 8.42 ml/m LA Biplane Vol: 22.1 ml 10.45 ml/m  AORTIC VALVE LVOT Vmax:   116.00 cm/s LVOT Vmean:  75.800 cm/s LVOT VTI:    0.186 m  AORTA Ao Root diam: 2.90 cm Ao Asc diam:  2.80 cm MITRAL VALVE               TRICUSPID VALVE MV Area (PHT): 4.80 cm    TR Peak grad:   24.4 mmHg MV Decel Time: 158 msec    TR Vmax:        247.00 cm/s MV E velocity: 76.45 cm/s MV A velocity: 75.80 cm/s  SHUNTS MV E/A ratio:  1.01        Systemic VTI:  0.19 m                            Systemic Diam: 2.20 cm Dalton McleanMD Electronically signed by Ezra Kanner Signature Date/Time: 09/23/2024/7:25:05 PM    Final    VAS US  LOWER EXTREMITY VENOUS (DVT) Result Date: 09/23/2024  Lower Venous DVT Study Patient Name:  KADINCE BOXLEY  Date of Exam:   09/23/2024 Medical Rec #: [id number]     Accession #:    [id number] Date of Birth: May 29, 2001    Patient Gender: F Patient Age:   22 years Exam Location:  [facility] Procedure:      VAS US  LOWER EXTREMITY VENOUS (DVT) Referring Phys: CAMILA NED --------------------------------------------------------------------------------  Indications: Pulmonary embolism.  Risk Factors: Oral contraceptive use. Comparison Study: No  previous exams Performing Technologist: Jody Hill RVT, RDMS  Examination Guidelines: A complete evaluation includes B-mode imaging, spectral Doppler, color Doppler, and power Doppler as needed of all accessible portions of each vessel. Bilateral testing is considered an integral part of a complete examination. Limited examinations for reoccurring indications may be performed as noted. The reflux portion of the exam is performed with the patient in reverse Trendelenburg.  +---------+---------------+---------+-----------+----------+--------------+ RIGHT    CompressibilityPhasicitySpontaneityPropertiesThrombus Aging +---------+---------------+---------+-----------+----------+--------------+ CFV      Full           Yes      Yes                                 +---------+---------------+---------+-----------+----------+--------------+ SFJ      Full                                                        +---------+---------------+---------+-----------+----------+--------------+ FV Prox  Full           Yes      Yes                                 +---------+---------------+---------+-----------+----------+--------------+ FV Mid   Full           Yes      Yes                                 +---------+---------------+---------+-----------+----------+--------------+ FV DistalFull           Yes      Yes                                 +---------+---------------+---------+-----------+----------+--------------+ PFV      Full                                                        +---------+---------------+---------+-----------+----------+--------------+ POP      Full           Yes      Yes                                 +---------+---------------+---------+-----------+----------+--------------+ PTV      Full                                                        +---------+---------------+---------+-----------+----------+--------------+ PERO     Full                                                         +---------+---------------+---------+-----------+----------+--------------+   +---------+---------------+---------+-----------+----------+--------------+ LEFT     CompressibilityPhasicitySpontaneityPropertiesThrombus Aging +---------+---------------+---------+-----------+----------+--------------+ CFV      Full           Yes      Yes                                 +---------+---------------+---------+-----------+----------+--------------+ SFJ      Full                                                        +---------+---------------+---------+-----------+----------+--------------+ FV Prox  Full           Yes      Yes                                 +---------+---------------+---------+-----------+----------+--------------+ FV Mid   Full  Yes      Yes                                 +---------+---------------+---------+-----------+----------+--------------+ FV DistalFull           Yes      Yes                                 +---------+---------------+---------+-----------+----------+--------------+ PFV      Full                                                        +---------+---------------+---------+-----------+----------+--------------+ POP      Full           Yes      Yes                                 +---------+---------------+---------+-----------+----------+--------------+ PTV      Full                                                        +---------+---------------+---------+-----------+----------+--------------+ PERO     Full                                                        +---------+---------------+---------+-----------+----------+--------------+     Summary: BILATERAL: - No evidence of deep vein thrombosis seen in the lower extremities, bilaterally. -No evidence of popliteal cyst, bilaterally.   *See table(s) above for measurements and observations. Electronically signed  by Lonni Gaskins MD on 09/23/2024 at 2:25:50 PM.    Final    CT Angio Chest PE W/Cm &/Or Wo Cm Result Date: 09/22/2024 EXAM: CTA CHEST 09/22/2024 08:54:38 PM TECHNIQUE: CTA of the chest was performed after the administration of intravenous contrast (iohexol  (OMNIPAQUE ) 350 MG/ML injection 80 mL IOHEXOL  350 MG/ML SOLN). Multiplanar reformatted images are provided for review. MIP images are provided for review. Automated exposure control, iterative reconstruction, and/or weight based adjustment of the mA/kV was utilized to reduce the radiation dose to as low as reasonably achievable. COMPARISON: None available. CLINICAL HISTORY: Pulmonary embolism (PE) suspected, low to intermediate prob, positive D-dimer. PE suspected, elevated Ddimer, lower leg and feet swelling, tachycardic. FINDINGS: PULMONARY ARTERIES: There are branching intraluminal filling defects identified within the lobar and segmental pulmonary arteries of the lower lobes bilaterally in keeping with acute pulmonary emboli. The embolic burden is moderate. MEDIASTINUM: No CT evidence of right heart strain. No significant coronary artery calcification. Global cardiac size within normal limits. No pericardial effusion. Thoracic Aorta is unremarkable. LYMPH NODES: No pathologic thoracic adenopathy. LUNGS AND PLEURA: Patchy ground glass opacity within the basilar lingula, which may represent atelectasis or infiltrate in the setting of a developing pulmonary infarct. Bibasilar atelectasis noted. No pneumothorax or pleural effusion. No central obstructing lesion. UPPER ABDOMEN: Limited images  of the upper abdomen are unremarkable. SOFT TISSUES AND BONES: No acute bone abnormality. No lytic or blastic bone lesion. IMPRESSION: 1. Acute pulmonary emboli involving the lobar and segmental pulmonary arteries of the lower lobes bilaterally, with moderate embolic burden. No CT evidence of right heart strain. 2. Patchy ground glass opacity within the basilar lingula,  possibly representing atelectasis or infiltrate in the setting of a developing pulmonary infarct.\ 3. Findings were discussed personally with Dr. Lenor at time of dictation Electronically signed by: Dorethia Molt MD 09/22/2024 09:16 PM EDT RP Workstation: [id number]   DG Chest 2 View Result Date: 09/22/2024 CLINICAL DATA:  SHOB EXAM: CHEST - 2 VIEW COMPARISON:  None available. FINDINGS: Lower lung volumes. Bilateral perihilar interstitial opacities. No focal airspace consolidation, pleural effusion, or pneumothorax. No cardiomegaly.No acute fracture or destructive lesion. IMPRESSION: Bilateral perihilar interstitial opacities, which may represent bronchovascular crowding due to low lung volumes, atypical/viral infection, or changes of asthma. Electronically Signed   By: Rogelia Myers M.D.   On: 09/22/2024 17:29    ASSESSMENT: This is a very pleasant 23 years old African-American female diagnosed with provoked acute pulmonary embolism in September 2025 and currently on treatment with Xarelto 10 mg p.o. daily.  The patient has underlying cause for her pulmonary embolism including obesity in addition to hormonal treatment with oral contraception and sedentary lifestyle.   PLAN: Assessment and Plan Assessment & Plan Pulmonary embolism, bilateral lower lobes, on anticoagulation Acute pulmonary embolism involving the lobar and segmental pulmonary arteries of the lower lobes bilaterally, with moderate embolic burden. Currently on Xarelto for anticoagulation. No symptoms of chest pain, shortness of breath, or bleeding. Risk factors include sedentary lifestyle, obesity, recent use of hormonal contraceptives, and family history of blood clots in the mother. - Continue Xarelto for six months. - Schedule follow-up appointment in six and a half months. - Order blood work to evaluate for genetic clotting disorders after completion of Xarelto treatment. - Provide education on Xarelto and lifestyle modifications  to reduce clot risk. - Advise to seek immediate medical attention if symptoms of clotting recur.  Obesity and sedentary lifestyle Obesity and sedentary lifestyle identified as contributing factors to the development of pulmonary embolism. - Encourage increased physical activity as tolerated.  Essential hypertension Recently diagnosed with hypertension, currently managed with metoprolol. No symptoms indicating poor control or complications. She was advised to call immediately if she has any concerning symptoms in the interval.  The patient voices understanding of current disease status and treatment options and is in agreement with the current care plan.  All questions were answered. The patient knows to call the clinic with any problems, questions or concerns. We can certainly see the patient much sooner if necessary.  Thank you so much for allowing me to participate in the care of Maisley Turck. I will continue to follow up the patient with you and assist in her care.  The total time spent in the appointment was 60 minutes including review of chart and various tests results, discussions about plan of care and coordination of care plan .   Disclaimer: This note was dictated with voice recognition software. Similar sounding words can inadvertently be transcribed and may not be corrected upon review.   Pamela Berg October 18, 2024, 12:25 PM

## 2024-10-18 NOTE — Patient Instructions (Signed)
 VISIT SUMMARY:  Today, we reviewed your recent diagnosis of pulmonary embolism and discussed your current treatment plan. We also addressed your hypertension and lifestyle factors that may contribute to your condition.  YOUR PLAN:  -PULMONARY EMBOLISM: A pulmonary embolism is a blood clot that blocks blood flow in the lungs. You are currently taking Xarelto to prevent further clots. Continue taking Xarelto for six months. We will schedule a follow-up appointment in six and a half months and order blood work to check for genetic clotting disorders after you finish the Xarelto treatment. Please seek immediate medical attention if you experience symptoms like chest pain or shortness of breath again.  -OBESITY AND SEDENTARY LIFESTYLE: Obesity and a sedentary lifestyle can increase the risk of blood clots. It is important to increase your physical activity as much as you can tolerate to help reduce this risk.  -ESSENTIAL HYPERTENSION: Hypertension, or high blood pressure, is being managed with metoprolol. Continue taking your medication as prescribed and monitor your blood pressure regularly.  INSTRUCTIONS:  Please schedule a follow-up appointment in six and a half months. We will also need to order blood work to evaluate for genetic clotting disorders after you complete your Xarelto treatment. Continue taking Xarelto and metoprolol as prescribed, and seek immediate medical attention if you experience any symptoms of clotting again.

## 2024-11-16 ENCOUNTER — Encounter (HOSPITAL_COMMUNITY): Payer: Self-pay

## 2024-11-16 ENCOUNTER — Other Ambulatory Visit (HOSPITAL_COMMUNITY): Payer: Self-pay

## 2024-11-17 ENCOUNTER — Other Ambulatory Visit (HOSPITAL_COMMUNITY): Payer: Self-pay

## 2025-05-03 ENCOUNTER — Inpatient Hospital Stay

## 2025-05-17 ENCOUNTER — Inpatient Hospital Stay: Admitting: Internal Medicine
